# Patient Record
Sex: Female | Born: 1950
Health system: Southern US, Community
[De-identification: ages and names within clinical notes are randomized; demographics above are authoritative.]

## PROBLEM LIST (undated history)

## (undated) DIAGNOSIS — I10 Essential (primary) hypertension: Secondary | ICD-10-CM

## (undated) DIAGNOSIS — K529 Noninfective gastroenteritis and colitis, unspecified: Secondary | ICD-10-CM

## (undated) DIAGNOSIS — I4891 Unspecified atrial fibrillation: Secondary | ICD-10-CM

## (undated) DIAGNOSIS — G5760 Lesion of plantar nerve, unspecified lower limb: Secondary | ICD-10-CM

## (undated) DIAGNOSIS — E039 Hypothyroidism, unspecified: Secondary | ICD-10-CM

## (undated) DIAGNOSIS — R0602 Shortness of breath: Secondary | ICD-10-CM

## (undated) DIAGNOSIS — I82409 Acute embolism and thrombosis of unspecified deep veins of unspecified lower extremity: Secondary | ICD-10-CM

## (undated) HISTORY — DX: Acute embolism and thrombosis of unspecified deep veins of unspecified lower extremity: I82.409

## (undated) HISTORY — PX: ABDOMINAL HYSTERECTOMY: SHX81

## (undated) HISTORY — PX: SHOULDER SURGERY: SHX246

## (undated) HISTORY — DX: Unspecified atrial fibrillation: I48.91

## (undated) HISTORY — DX: Shortness of breath: R06.02

## (undated) HISTORY — PX: NECK SURGERY: SHX720

## (undated) HISTORY — DX: Lesion of plantar nerve, unspecified lower limb: G57.60

## (undated) HISTORY — PX: BREAST CYST ASPIRATION: SHX578

## (undated) HISTORY — DX: Noninfective gastroenteritis and colitis, unspecified: K52.9

## (undated) HISTORY — PX: AUGMENTATION MAMMAPLASTY: SUR837

## (undated) HISTORY — DX: Hypothyroidism, unspecified: E03.9

---

## 1997-12-19 ENCOUNTER — Ambulatory Visit (HOSPITAL_COMMUNITY): Admission: RE | Admit: 1997-12-19 | Discharge: 1997-12-19 | Payer: Self-pay | Admitting: Gastroenterology

## 1998-09-19 ENCOUNTER — Encounter: Payer: Self-pay | Admitting: Family Medicine

## 1998-09-19 ENCOUNTER — Ambulatory Visit (HOSPITAL_COMMUNITY): Admission: RE | Admit: 1998-09-19 | Discharge: 1998-09-19 | Payer: Self-pay | Admitting: Family Medicine

## 1999-07-30 HISTORY — PX: GALLBLADDER SURGERY: SHX652

## 2002-12-06 ENCOUNTER — Encounter: Admission: RE | Admit: 2002-12-06 | Discharge: 2002-12-06 | Payer: Self-pay | Admitting: Gastroenterology

## 2002-12-06 ENCOUNTER — Encounter: Payer: Self-pay | Admitting: Gastroenterology

## 2003-04-25 ENCOUNTER — Encounter: Payer: Self-pay | Admitting: Family Medicine

## 2003-04-25 ENCOUNTER — Encounter: Admission: RE | Admit: 2003-04-25 | Discharge: 2003-04-25 | Payer: Self-pay | Admitting: Family Medicine

## 2004-03-16 ENCOUNTER — Observation Stay (HOSPITAL_COMMUNITY): Admission: EM | Admit: 2004-03-16 | Discharge: 2004-03-17 | Payer: Self-pay | Admitting: Emergency Medicine

## 2004-04-16 ENCOUNTER — Ambulatory Visit (HOSPITAL_COMMUNITY): Admission: RE | Admit: 2004-04-16 | Discharge: 2004-04-16 | Payer: Self-pay | Admitting: Gastroenterology

## 2004-05-14 ENCOUNTER — Ambulatory Visit (HOSPITAL_COMMUNITY): Admission: RE | Admit: 2004-05-14 | Discharge: 2004-05-14 | Payer: Self-pay | Admitting: Gastroenterology

## 2004-05-14 ENCOUNTER — Encounter (INDEPENDENT_AMBULATORY_CARE_PROVIDER_SITE_OTHER): Payer: Self-pay | Admitting: *Deleted

## 2005-04-18 ENCOUNTER — Ambulatory Visit (HOSPITAL_COMMUNITY): Admission: RE | Admit: 2005-04-18 | Discharge: 2005-04-18 | Payer: Self-pay | Admitting: Cardiology

## 2006-03-20 ENCOUNTER — Encounter: Admission: RE | Admit: 2006-03-20 | Discharge: 2006-03-20 | Payer: Self-pay | Admitting: Orthopaedic Surgery

## 2006-06-26 ENCOUNTER — Inpatient Hospital Stay (HOSPITAL_COMMUNITY): Admission: RE | Admit: 2006-06-26 | Discharge: 2006-06-28 | Payer: Self-pay | Admitting: Orthopaedic Surgery

## 2006-10-04 ENCOUNTER — Emergency Department (HOSPITAL_COMMUNITY): Admission: EM | Admit: 2006-10-04 | Discharge: 2006-10-04 | Payer: Self-pay | Admitting: Emergency Medicine

## 2007-03-21 ENCOUNTER — Emergency Department (HOSPITAL_COMMUNITY): Admission: EM | Admit: 2007-03-21 | Discharge: 2007-03-21 | Payer: Self-pay | Admitting: Emergency Medicine

## 2007-03-27 ENCOUNTER — Ambulatory Visit (HOSPITAL_BASED_OUTPATIENT_CLINIC_OR_DEPARTMENT_OTHER): Admission: RE | Admit: 2007-03-27 | Discharge: 2007-03-28 | Payer: Self-pay | Admitting: Orthopedic Surgery

## 2010-12-03 ENCOUNTER — Other Ambulatory Visit: Payer: Self-pay | Admitting: Family Medicine

## 2010-12-03 DIAGNOSIS — N644 Mastodynia: Secondary | ICD-10-CM

## 2010-12-10 ENCOUNTER — Ambulatory Visit
Admission: RE | Admit: 2010-12-10 | Discharge: 2010-12-10 | Disposition: A | Discharge status: Home / Self Care | Payer: BC Managed Care – PPO | Source: Ambulatory Visit | Attending: Family Medicine | Admitting: Family Medicine

## 2010-12-10 DIAGNOSIS — N644 Mastodynia: Secondary | ICD-10-CM

## 2010-12-11 NOTE — Op Note (Signed)
NAME:  Debbie Walters, Debbie Walters              ACCOUNT NO.:  [id number]   MEDICAL RECORD NO.:  [id number]          PATIENT TYPE:  AMB   LOCATION:  DSC                          FACILITY:  [facility]   PHYSICIAN:  Robert A. Thurston Hole, M.D. DATE OF BIRTH:  [birth year]   DATE OF PROCEDURE:  03/27/2007  DATE OF DISCHARGE:                               OPERATIVE REPORT   PREOPERATIVE DIAGNOSIS:  Right mid shaft displaced clavicle fracture.   POSTOPERATIVE DIAGNOSIS:  Right mid shaft displaced clavicle fracture.   PROCEDURE:  Open reduction and internal fixation of right clavicle  fracture.   SURGEON:  Elana Alm. Thurston Hole, M.D.   ASSISTANT:  Julien Girt, P.A.   ANESTHESIA:  General.   OPERATIVE TIME:  Forty-five minutes.   COMPLICATIONS:  None.   INDICATIONS FOR PROCEDURE:  Debbie Walters is a 60 year old woman who had a  significant fall approximately 10 days ago, significant pain with  displaced mid shaft clavicle fracture and is now to undergo ORIF of  this.   DESCRIPTION:  Debbie Walters is brought to the operating room on March 27, 2007, placed on the operative table in the supine position.  After an  adequate level of general anesthesia was obtained, she was placed in a  beach-chair position and her shoulder and arm was prepped using sterile  DuraPrep and draped using sterile technique.  She received Ancef 1 gram  IV preoperatively for prophylaxis.  Initially, through a 5 cm transverse  incision based over the mid shaft clavicle fracture, initial exposure  was made.  The underlying subcutaneous tissues were incised along with  the skin incision.  Carefully the fracture was exposed and the  periosteum was carefully reflected back exposing both the medial and  lateral aspects of the fracture.  The fracture was then held in a  reduced position while an anterior to posterior lag screw was placed  across the fracture site using standard AO technique and a 3.5 mm  cortical screw the appropriate length  was placed.  The provisionally  held the fracture in a reduced and satisfactory position.  A 6-hole  plate was then placed on the superior surface of the clavicle.  The two  most medial drill holes drilled, measured, tapped in the appropriate  length.  A 3.5 mm cortical screw was placed and the two most lateral  screw holes drilled, measured, tapped in the appropriate length, 3.5 mm  cortical screws placed as well.  After this was done, intraoperative  fluoroscopy confirmed anatomic reduction of the fracture with  satisfactory position in the hardware.  They were comminuted fragments  from the mid portion of the fracture and these were placed back into  this portion of the fracture site.  While screw placement was performed,  very careful retraction and very careful retractors were placed to  protect the drills.  At this point, then the wound was irrigated.  The  shoulder could be brought through a full range of motion with excellent  stability of the fracture.  The periosteum was then closed over the  plate and over the clavicle with interrupted 0  Vicryl suture.  Subcutaneous tissues closed with 2-0 Vicryl.  Subcuticular layer closed  with 4-0 Prolene.  Sterile dressings were applied and a sling, and the  patient awakened and taken to the recovery room in stable condition.  Needle and sponge counts correct x2 at the end of the case.   FOLLOW-UP CARE:  Ms. Orzechowski will be followed overnight in the recovery  care center for IV pain control and neurovascular monitoring.  Discharge  tomorrow on Percocet and Robaxin.  She can come back in the office in a  week for wound check and followup.      Robert A. Thurston Hole, M.D.  Electronically Signed     RAW/MEDQ  D:  03/27/2007  T:  03/27/2007  Job:  [id number]

## 2010-12-14 NOTE — Consult Note (Signed)
NAME:  Debbie Walters, Debbie Walters              ACCOUNT NO.:  [id number]   MEDICAL RECORD NO.:  [id number]          PATIENT TYPE:  AMB   LOCATION:  SDS                          FACILITY:  [facility]   PHYSICIAN:  Chief Technology Officer, P.A.    DATE OF BIRTH:  [birth year]   DATE OF CONSULTATION:  DATE OF DISCHARGE:                                   CONSULTATION   CHIEF COMPLAINT:  Neck and right upper extremity pain.   HISTORY OF PRESENT ILLNESS:  The patient is a 60 year old female who has  been having problems with her neck going into her right upper extremity for  some time now.  She was found to have significant degenerative disk disease  as well as foraminal narrowing.  She has failed conservative treatments.  It  is felt her best course of management would be an ACDF procedure from C3 to  C7.  The risks and benefits of this procedure were discussed with the  patient by Dr. Noel Gerold.  She indicated understanding and opted to proceed.   ALLERGIES:  Peanuts.   MEDICATIONS:  Synthroid.   PAST MEDICAL HISTORY:  Hypothyroidism.   PAST SURGICAL HISTORY:  Hysterectomy and knee surgery.   SOCIAL HISTORY:  The patient denies tobacco use.  She drinks one drink of  alcohol per month.  She has three children.   FAMILY HISTORY:  Noncontributory.   REVIEW OF SYSTEMS:  The patient denies any fevers, chills, sweats, or  bleeding tendencies.  CNS:  Denies blurred vision, double vision, seizures,  headaches, paralysis.  CARDIOVASCULAR:  No chest pain, angina, orthopnea,  claudication, or palpitations.  PULMONARY:  No shortness of breath,  productive cough, or hemoptysis.  GI:  Denies nausea, vomiting,  constipation, diarrhea, melena, or bloody stool.  GU:  Denies history of  hematuria or discharge.  MUSCULOSKELETAL:  As per HPI.   PHYSICAL EXAMINATION:  VITAL SIGNS:  Blood pressure 120/70, respirations are  16 and unlabored.  GENERAL APPEARANCE:  The patient is 60 year old female who is alert and  oriented, in no  acute distress.  She is well nourished, well groomed,  appears stated age, pleasant and cooperative to exam.  HEENT:  Head is normocephalic, atraumatic.  Pupils equal, round, and  reactive to light.  Extraocular movements intact.  Nares patent.  Pharynx is  clear.  NECK:  Soft and supple to palpation.  No lymphadenopathy, thyromegaly, or  bruits appreciated.  CHEST:  Clear to auscultation bilaterally, no rales, rhonchi, stridor,  wheezes, or friction rubs present, no pertinent and not performed.  HEART:  S1, S2, regular rate and rhythm, no murmurs, gallops, or rubs noted.  ABDOMEN:  Soft to palpation, nontender, nondistended, no organomegaly noted.  GU:  Not pertinent and not performed.  EXTREMITIES:  As per HPI.  SKIN:  Intact without lesions or rashes.   IMPRESSION:  1. Cervical spondylytic radiculopathy C3 to C7.  2. Hypothyroidism.   PLAN:  1. Admit to [facility] on June 26, 2006 for an ACDF C3 to C7.  2. The patient's primary care doctor is Dr. Merri Brunette.  Verlin Fester, P.A.     CM/MEDQ  D:  06/12/2006  T:  06/12/2006  Job:  [id number]   cc:   Sharolyn Douglas, M.D.

## 2010-12-14 NOTE — Cardiovascular Report (Signed)
NAME:  Debbie Walters, Debbie Walters                        ACCOUNT NO.:  [id number]   MEDICAL RECORD NO.:  [id number]                   PATIENT TYPE:  INP   LOCATION:  1826                                 FACILITY:  [facility]   PHYSICIAN:  Thereasa Solo. Little, M.D.              DATE OF BIRTH:  [birth year]   DATE OF PROCEDURE:  03/16/2004  DATE OF DISCHARGE:                              CARDIAC CATHETERIZATION   INDICATIONS FOR TEST:  This 60 year old female presents with a 2-week  history of chest pain, a week ago severe chest pain lasting 18 hours,  associated with marked diaphoresis and shortness of breath, moderate fatigue  since that time, and then recurrent mild pain starting on Tuesday, and now,  3 days later, severe chest pain, elephant sitting on her chest, diaphoretic,  presented to [facility], had no acute changes and was sent to [facility].  She is still having 3-4/10 chest discomfort.  Her examination is  unremarkable, but her family history includes a sister at age 69 with an MI.   DESCRIPTION OF PROCEDURE:  The patient was taken to the cath lab on an  urgent basis for a left heart catheterization.   After obtaining informed consent, the patient was prepped and draped in the  usual sterile fashion, exposing the right groin.  After applying local  anesthetic with 1% Xylocaine, the Seldinger technique was employed and a 6-  [place] introducer sheath was placed into the right femoral artery.  Left and  right coronary arteriography and ventriculography in the RAO projection were  performed.   EQUIPMENT:  The 6-French Judkins configuration catheters.   TOTAL CONTRAST:  100 mL.   COMPLICATIONS:  None.   RESULTS:  Central aortic pressure was 123/75, left ventricular pressure was  119/3 and there was no aortic valve gradient noted at time of pullback.   VENTRICULOGRAPHY:  Ventriculography in the RAO projection using 20 mL of  contrast at 12 mL per second revealed normal left  ventricular systolic  function.  Ejection fraction greater than 55% and the left ventricular end-  diastolic pressure was 8.   CORONARY ARTERIOGRAPHY:  On fluoroscopy, no calcification was seen.   1. Left main normal, it bifurcated.  2. LAD:  The LAD extended to the apex of the heart.  There was a very large     first diagonal that was free of disease.  3. Circumflex:  The circumflex gave rise to 2 OM vessels, both of which were     free of disease.  4. RCA:  The RCA supplied the PDA, and the RCA and PDA were free of disease.   CONCLUSION:  1. No evidence of coronary artery disease.  2. Normal left ventricular systolic function.   DISCUSSION:  Her story is excellent for angina, but she clearly has no  coronary disease and she has nothing to suggest coronary spasm with a  negative first troponin,  which should still be elevated if she has had pain  all week long, and no wall motion abnormalities.   I have ordered a CT scan of her chest to rule out a pulmonary embolus and  she should be ready for discharge in the morning.  If the CT scan is  unremarkable, she will need a GI evaluation and I will refer her back to Dr.  Dario Guardian for this.                                               Thereasa Solo. Little, M.D.    ABL/MEDQ  D:  03/16/2004  T:  03/17/2004  Job:  [id number]   cc:   Dario Guardian, M.D.  [address]  [place]  [place] [address]  Fax: [phone]   [facility]

## 2010-12-14 NOTE — Op Note (Signed)
NAME:  Debbie Walters, Debbie Walters              ACCOUNT NO.:  [id number]   MEDICAL RECORD NO.:  [id number]          PATIENT TYPE:  AMB   LOCATION:  ENDO                         FACILITY:  [facility]   PHYSICIAN:  Anselmo Rod, M.D.  DATE OF BIRTH:  Dec 03, 1950   DATE OF PROCEDURE:  05/14/2004  DATE OF DISCHARGE:                                 OPERATIVE REPORT   PROCEDURE PERFORMED:  Esophagogastroduodenoscopy with gastric and small  bowel biopsies.   ENDOSCOPIST:  Charna Elizabeth, M.D.   INSTRUMENT USED:  Olympus video panendoscope.   INDICATIONS FOR PROCEDURE:  The patient is a 60 year old white female with a  history of epigastric pain and intermittent diarrhea, peptic ulcer disease,  small biopsy done to rule out sprue.   PREPROCEDURE PREPARATION:  Informed consent was procured from the patient.  The patient was fasted for eight hours prior to the procedure.   PREPROCEDURE PHYSICAL:  The patient had stable vital signs.  Neck supple,  chest clear to auscultation.  S1, S2 regular.  Abdomen soft with normal  bowel sounds.   DESCRIPTION OF PROCEDURE:  The patient was placed in the left lateral  decubitus position and sedated with 50 mg of Demerol and 5 mg of Versed in  slow incremental doses.  Once the patient was adequately sedated and  maintained on low-flow oxygen and continuous cardiac monitoring, the Olympus  video panendoscope was advanced through the mouth piece over the tongue into  the esophagus under direct vision.  The entire esophagus appeared normal  with no evidence of ring, stricture, masses, esophagitis or Barrett's  mucosa.  The scope was then advanced to the stomach.  A superficial ulcer  was in the antrum with multiple erosions.  Biopsies were done to rule out  presence of Helicobacter pylori by pathology.  Small bowel biopsies were  done to rule out sprue, no small bowel lesions were identified.  There was  no outlet obstruction.  Retroflexion in the high cardia revealed no  abnormalities.  The patient tolerated the procedure well without  complications.   IMPRESSION:  1.  Normal-appearing esophagus and proximal small bowel.  2.  Superficial ulcer in antrum with multiple erosions.  Biopsies done for      Helicobacter pylori by pathology.  3.  Small bowel biopsy done to rule out sprue.   RECOMMENDATIONS:  1.  Await pathology results.  2.  Continue proton pump inhibitor.  3.  Avoid all nonsteroidals.  4.  Outpatient followup in the next two weeks for further recommendations.      Will treat with antibiotics if Helicobacter pylori present on biopsy.       JNM/MEDQ  D:  05/14/2004  T:  05/14/2004  Job:  [id number]   cc:   Dario Guardian, M.D.  [address]  [place]  [place] [address]  Fax: [phone]

## 2010-12-14 NOTE — Op Note (Signed)
NAME:  Debbie Walters, Debbie Walters              ACCOUNT NO.:  [id number]   MEDICAL RECORD NO.:  [id number]          PATIENT TYPE:  OIB   LOCATION:  2550                         FACILITY:  [facility]   PHYSICIAN:  Sharolyn Douglas, M.D.        DATE OF BIRTH:  Aug 08, 1950   DATE OF PROCEDURE:  06/26/2006  DATE OF DISCHARGE:                               OPERATIVE REPORT   DIAGNOSIS:  1. Cervical spondylotic radiculopathy.  2. Neck and bilateral upper extremity pain and stiffness.   PROCEDURE:  1. Anterior cervical discectomy C3-C4, C4-C5, C5-C6 and C6-C7 with      decompression of the thecal sac and nerve roots bilaterally.  2. Anterior cervical arthrodesis C3-C4, C4-C5, C5-C6, C6-C7 with      placement of four PEEK spacers packed with autogenous iliac crest      bone graft.  3. Anterior cervical plating from C3 to C7 using the [employer]      System.  4. Left anterior iliac crest bone graft supplemented with local      autogenous bone graft.   SURGEON:  Sharolyn Douglas, M.D.   ASSISTANTJill Side Mahar, P.A.-C.   ANESTHESIA:  General endotracheal.   ESTIMATED BLOOD LOSS:  50 mL.   COMPLICATIONS:  None.   COUNTS:  Needle and sponge count correct.   INDICATIONS:  The patient is a pleasant 60 year old female with chronic  progressive neck and bilateral upper extremity pain and stiffness.  She  has a degenerative spondylolisthesis at C3-C4, multilevel spondylosis  with disc space narrowing most advanced at C5-C6 and C6-C7.  Because of  her persistent symptoms refractory to all conservative treatment, she  now elects to undergo ACDF from C3 to C7 in hopes of improving her  symptoms.  The risks, benefits, and alternatives were reviewed and she  has elected to proceed.   DESCRIPTION OF PROCEDURE:  After informed consent, she was taken to the  operating room.  She underwent general endotracheal anesthesia without  difficulty.  She was given prophylactic IV antibiotics.  She was  carefully positioned  prone with the Mayfield headrest and the neck in  slight extension.  5 pounds of halter traction was applied.  The neck  was prepped and draped in the usual sterile fashion.  An incision was  made in a natural skin crease just above the cricoid cartilage from just  past the midline extending laterally.  Dissection was carried sharply  through the platysma.  The interval between the SCM and strap muscles  was developed down to the prevertebral space.  The anterior cervical  spine was identified and a spinal needle was placed into the C4-C5 disc  space and x-ray taken to confirm the level.  The esophagus, trachea, and  carotid sheath were identified and protected at all times.   The longus coli muscle was elevated out over the C3 through C7 disc  spaces.  The anterior osteophytes were removed with a Leksell rongeur.  The Shadowline retractor was placed.  The microscope was draped and  brought into the field.  Caspar distraction pins were placed into the  C3  and C4 vertebral bodies.  A discectomy was completed back to the  posterior longitudinal ligament.  The cartilaginous endplates were  scraped clean.  A high speed bur was used take down the posterior  vertebral margins and uncovertebral joints.  The posterior longitudinal  ligament was taken down using micro-Kerrison punches.  We identified a  disc osteophyte complex on the right side and this was carefully removed  from the spinal canal and foramen.   Once we were satisfied with the decompression, we turned our attention  to obtaining bone graft from the left anterior iliac crest.  A 2 cm  incision was made.  Dissection was carried through the deep fascia.  The  iliac crest was identified and a Leksell rongeur was used to remove the  cap.  Curets were then used to remove copious amounts of autogenous  iliac crest bone graft.  Hemostasis was achieved using FloSeal.  The  deep fascia was closed with an 0 Vicryl, the subcutaneous layer  was  closed with 2-0 Vicryl, and then Dermabond was used on the skin.   We then turned our attention back to the cervical spine.  A 6-mm PEEK  cage was chosen and packed with the autogenous iliac crest bone graft.  This was supplemented with some local bone graft obtained from the  drilling of the uncovertebral joints.  The spacer was inserted into the  interspace and tamped 1 mm deep.  We then moved the [facility] distraction  pins down to C4-C5 and a similar procedure was carried out.  Again, we  performed bilateral foraminotomies.  The posterior longitudinal ligament  was not taken down at this level.  A 6 mm spacer was again utilized and  again packed with the autogenous iliac crest bone graft and the local  bone graft.  The cage was again countersunk 1 mm.  We then moved the  [facility] distraction pins down to C5-C6.  At this level, the disc space  was severely narrowed.  The uncovertebral joints were taken down using  the high speed drill and foraminotomies were completed.  The disc was  mobile and ultimately we were able to place a 6-mm PEEK spacer, again  packed with the autogenous iliac crest bone graft.  The graft was  countersunk 1 mm.  Caspar distraction pins were moved down to the C6-C7  level. Again, the disc space was very narrowed but after a radical  discectomy and foraminotomy, we were able to achieve some distraction  and a 6-mm PEEK spacer was utilized, again packed with the autogenous  iliac crest bone graft and local bone graft.  The spacer was countersunk  1 mm.   We then placed an anterior cervical plate from C3 down to C7 using ten  12 mm screws.  We ensured that the locking mechanism engaged.  The bone  quality was soft, however, the screw purchase was adequate.  We took an  interoperative x-ray which showed appropriate positioning of the  implants.  The wound was irrigated.  Hemostasis was achieved.  The esophagus, trachea, carotid sheath were examined and there were  no  apparent injuries.  A deep TLS sucker was left in place.  The platysma  was closed with an interrupted 2-0 Vicryl suture, the subcutaneous layer  closed with an interrupted 3-0 Vicryl suture, followed by a running 4-0  subcuticular Vicryl suture on the skin edges.  Benzoin and Steri-Strips  were placed.  A sterile dressing was applied.  The patient was placed  into an Aspen cervical collar, extubated without difficulty, and  transferred to recovery in stable condition able to move her upper lower  extremities.   It should be noted my assistant, [employer], P.A.-C., was present  throughout the procedure including during positioning, the exposure, the  decompression, the instrumentation, the arthrodesis, and she also  assisted with the bone graft and wound closure.      Sharolyn Douglas, M.D.  Electronically Signed     MC/MEDQ  D:  06/26/2006  T:  06/27/2006  Job:  [id number]

## 2011-01-07 ENCOUNTER — Observation Stay (HOSPITAL_COMMUNITY)
Admission: AD | Admit: 2011-01-07 | Discharge: 2011-01-08 | DRG: 183 | Disposition: A | Discharge status: Home / Self Care | Payer: BC Managed Care – PPO | Source: Ambulatory Visit | Attending: Cardiology | Admitting: Cardiology

## 2011-01-07 DIAGNOSIS — I2510 Atherosclerotic heart disease of native coronary artery without angina pectoris: Secondary | ICD-10-CM | POA: Insufficient documentation

## 2011-01-07 DIAGNOSIS — K319 Disease of stomach and duodenum, unspecified: Principal | ICD-10-CM | POA: Insufficient documentation

## 2011-01-07 DIAGNOSIS — I498 Other specified cardiac arrhythmias: Secondary | ICD-10-CM | POA: Insufficient documentation

## 2011-01-07 DIAGNOSIS — E039 Hypothyroidism, unspecified: Secondary | ICD-10-CM | POA: Insufficient documentation

## 2011-01-07 DIAGNOSIS — I10 Essential (primary) hypertension: Secondary | ICD-10-CM | POA: Insufficient documentation

## 2011-01-07 DIAGNOSIS — G43909 Migraine, unspecified, not intractable, without status migrainosus: Secondary | ICD-10-CM | POA: Insufficient documentation

## 2011-01-07 DIAGNOSIS — R0789 Other chest pain: Secondary | ICD-10-CM | POA: Insufficient documentation

## 2011-01-07 LAB — CARDIAC PANEL(CRET KIN+CKTOT+MB+TROPI): CK, MB: 2.8 ng/mL (ref 0.3–4.0)

## 2011-01-07 LAB — COMPREHENSIVE METABOLIC PANEL
ALT: 37 U/L — ABNORMAL HIGH (ref 0–35)
AST: 27 U/L (ref 0–37)
Albumin: 3.7 g/dL (ref 3.5–5.2)
Alkaline Phosphatase: 74 U/L (ref 39–117)
BUN: 18 mg/dL (ref 6–23)
GFR calc Af Amer: >60 mL/min (ref >60)
Glucose, Bld: 92 mg/dL (ref 70–99)
Total Bilirubin: 0.2 mg/dL — ABNORMAL LOW (ref 0.3–1.2)

## 2011-01-07 LAB — CBC
HCT: 36.1 % (ref 36.0–46.0)
Hemoglobin: 12.5 g/dL (ref 12.0–15.0)
RBC: 4.19 MIL/uL (ref 3.87–5.11)
RDW: 13.3 % (ref 11.5–15.5)
WBC: 7.1 10*3/uL (ref 4.0–10.5)

## 2011-01-07 LAB — PROTIME-INR: Prothrombin Time: 13.3 seconds (ref 11.6–15.2)

## 2011-01-08 HISTORY — PX: CARDIAC CATHETERIZATION: SHX172

## 2011-01-08 LAB — CARDIAC PANEL(CRET KIN+CKTOT+MB+TROPI)
Total CK: 134 U/L (ref 7–177)
Troponin I: <0.3 ng/mL (ref <0.30)

## 2011-01-08 LAB — CBC
Hemoglobin: 12.2 g/dL (ref 12.0–15.0)
MCV: 86.4 fL (ref 78.0–100.0)
Platelets: 199 10*3/uL (ref 150–400)
RDW: 13.5 % (ref 11.5–15.5)

## 2011-01-08 LAB — LIPID PANEL
Cholesterol: 181 mg/dL (ref 0–200)
LDL Cholesterol: 110 mg/dL — ABNORMAL HIGH (ref 0–99)
VLDL: 18 mg/dL (ref 0–40)

## 2011-01-14 NOTE — H&P (Signed)
NAMEMarland Kitchen  Debbie Walters, Debbie Walters NO.:  [id number]  MEDICAL RECORD NO.:  [id number]  LOCATION:  3705                         FACILITY:  [facility]  PHYSICIAN:  Debbie Corporal, MD DATE OF BIRTH:  1950/10/15  DATE OF ADMISSION:  01/07/2011 DATE OF DISCHARGE:                             HISTORY & PHYSICAL   REFERRING PHYSICIAN:  Dario Guardian, MD, but she was actually seen by Debbie Walters, PAC at [facility].  REASON FOR ADMISSION:  Chest pain, rule out MI.  HISTORY OF PRESENT ILLNESS:  Ms. Debbie Walters is a very pleasant 60 year old woman with a distant history of hypertension, but not only current medications, other than occasional furosemide as well as hypothyroidism, on Synthroid.  She has had a heart catheterization back in 2005 by Dr. Clarene Walters for symptoms concerning for coronary disease and was noted to have nonobstructive coronary disease.  She also has had an echocardiogram, which was essentially normal with EF 55% with no significant abnormalities in 2007.  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  She began having substernal chest pain, severe lower substernal chest pain about 10/10 at the worst associated with feeling diaphoretic, was one nauseated, and short of breath.  The pain did go toward her jaw and up to her left arm and shoulders.  The pain would kind of come and go throughout the course of the day on Friday.  She continued to work, but was really kind of feeling poorly, but she continued to work throughout the day.  The pain would kinda come and then, will go when she would sit down and then would come back again and it did this throughout most of the day and then, when she got home, was resting, and then was somewhat improved, but the next day, on Saturday she felt extremely poor and was felt lethargic and fatigued.  She still has some residual heaviness  in her chest that would definitely get worse with getting on moving around, but she continued to try to work throughout the day which is basically felt poorly the entire day.  She did not have any PND, orthopnea, or edema associated with this and had normal shortness of breath after Friday.  She then basically stayed in bed all day Sunday and then, today when she tried to get up and start doing things she again noted feeling a chest heaviness and little bit of shortness of breath and so, she went to her primary care's office.  There, she was given 3 baby aspirin which really helped the pain.  After she had taken a GI cocktail which really did not help her very much.  She really is complaining of any reflux- type symptoms and said that she just generally feels just kinda lethargic and was a little heaviness in her chest.  That kinda comes and goes, walking into the clinic, for instance.  She otherwise denies any real worsening of her edema just off and on intermittently for which she takes her Lasix.  No palpitations.  No lightheadedness, dizziness, ooziness, syncope, no claudication.  No melena or  hematochezia.  No recent fevers, chills, or cough.  No wheezing.  REVIEW OF SYSTEMS:  Otherwise, no GU symptoms, just some occasional constipation for GI symptoms, has some musculoskeletal joint pains, but nothing out of the ordinary for her.  No depression, anxiety-type symptoms.  No visual changes, no TIA or amaurosis fugax symptoms.  No syncope or near-syncope-type symptoms.  Just overall just fatigue and chest tightness is the main issues as well as noted above.  CURRENT MEDICATIONS: 1. Furosemide 20 mg tablets once a day p.r.n. pain. 2. Levothyroxine 125 mcg daily.  ALLERGIES:  No known drug allergies.  PAST MEDICAL HISTORY:  As indicated above: 1. Hypothyroidism. 2. History of hemorrhoid. 3. No history of cardiac evaluation with a normal echocardiogram 2007     and essentially, no  significant coronary artery disease and     catheterization in 2005 by Dr. Julieanne Walters. 4. Bilateral salpingo-oophorectomy for cervical cancer in the past.  SURGICAL HISTORY: 1. Hysterectomy. 2. Bilateral salpingo-oophorectomy for cervical cancer in the past. 3. History of breast augmentation surgery. 4. Cholecystectomy. 5. Cervical spine fusions, laminectomy. 6. Shoulder surgery for a fall and fracture in 2007.  FAMILY HISTORY:  Father died  at 77 years of a cirrhosis and was a long- term drinker.  Father died of, it sounds like some type of pancreatitis or pancreatic cancer, had an MI as well as renal insufficiency and diabetes.  Paternal grandmother and grandfather were died in the 25s in an early coronary disease.  She had maternal grandfather died of 83 with coronary disease and maternal grandmother died at 54 with coronary disease, had a brother died of 43 with coronary disease, had an MI, he also has a diabetes.  Another brother is a nondrinker.  He has had pancreatitis.  She has got a sister who died at 61 with MI and she had coronary disease and diabetes.  Another sister died at 75 with a coronary disease and had diabetes.  SOCIAL HISTORY:  She never smoked and has not really exposed to smoking. She has occasional alcohol beverage.  No recreational drug use.  She does not have any structure alcohol.  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  She is divorced with 3 children, 5 grandchildren.  She says she tries to walk maybe once a week in the gym for 60 minutes a day, but has not done it recently.  From [place], she was given GI cocktail with no help, 3 baby aspirins and that is somewhat alleviated her symptoms.  She had troponins drawn, which was still pending.  EKG:  There was basically sinus bradycardia and essentially normal EKG, heart rate was 54 beats per minute.  On repeat EKG here with 1/10 chest heaviness, heart rate again was  54 beats per minute, normal sinus bradycardia with normal EKG.  No ischemic changes.  PHYSICAL EXAMINATION:  GENERAL:  She is very pleasant, somewhat distressed woman, but she looks nontoxic and not currently having chest discomfort. VITAL SIGNS:  She is 5 feet 2 inches, 156 pounds with BMI of 27.  Her blood pressures elevated 170/100, heart rate is 54 beats per minute. HEENT: Normocephalic, atraumatic.  Extraocular muscles intact. NECK:  Supple without lymphadenopathy, thyromegaly, JVD.  No carotid bruit. LUNGS:  Clear to auscultation and percussion bilaterally.  Normal respiratory effort.  Good air movement.  No wheezes, rales, or rhonchi. HEART:  Regular rate and rhythm, normal S1 and S2.  No murmurs, rubs, or gallops.  A 2+ and equal pulses throughout.  Normal placed PMI. ABDOMEN:  Soft, nontender, and nondistended.  Normoactive bowel sounds. No hepatosplenomegaly. EXTREMITIES:  No clubbing, cyanosis, or edema.  A 2+ and equal pulses throughout. MUSCULOSKELETAL:  I was unable to palpate an reproduce any of her chest discomfort symptoms.  IMPRESSION: 1. Chest pain with her history of negative cath is quite likely     nonanginal, but she has extensive past medical history and does     have a history of hypertension and is being hypertensive today.     This could just be due to hypertension, but I think with all the     symptoms, she had on Friday and then, persistently having chest     heaviness and pressure with any type of activity for the last 3     days.  This is worrisome if this could be an acute coronary     syndrome with potentially either post-infarct or post-acute     coronary syndrome angina-type equivalent.  I definitely want to see     what the troponins look like, but I think it just for safety     purposes would prefer to go ahead and have her admitted directly to     the hospital.  She does want to go home and get a bag, but she can     be directly admitted to  the hospital for initiation of intravenous     heparin and nitroglycerin, to get her blood pressure down.  I think     I am just going to go ahead and schedule her to undergo a heart     catheterization tomorrow, but one of my colleagues in order to     definitely assess her coronary anatomy, answer the question once     for all.  I am reluctant to trust the stress test with her ongoing     symptoms that this initially would be a safe option.  I did discuss     the risks, benefits, alternatives, and indications of a     catheterization with her.  She regrets not having come in to see     someone on Friday, but does understand that agree with the decision     to proceed as such.  So we will go ahead and get scheduled for     direct admission.  Discussed with our inpatient service.  I will     then see her in follow up afterwards. 2. I am going to start her amlodipine versus an ACE inhibitor     depending 1 mL what is found.  I will put on a low-dose beta     blocker, although, I will hold parameters for heart rate     approximately 60 beats per minute, which will likely mean it is     held.          ______________________________ Debbie Corporal, MD     DWH/MEDQ  D:  01/07/2011  T:  01/08/2011  Job:  [id number]  cc:   Debbie Josephs, PA Debbie Walters, M.D.  Electronically Signed by Bryan Lemma MD on 01/14/2011 07:28:51 AM

## 2011-01-19 NOTE — Discharge Summary (Signed)
Debbie Walters, Debbie NO.:  [id number]  MEDICAL RECORD NO.:  [id number]  LOCATION:  3705                         FACILITY:  [facility]  PHYSICIAN:  Landry Corporal, MD DATE OF BIRTH:  [birth year]  DATE OF ADMISSION:  01/07/2011 DATE OF DISCHARGE:  01/08/2011                              DISCHARGE SUMMARY   DISCHARGE DIAGNOSES: 1. Chest pain, noncardiac secondary to gastrointestinal source. 2. Migraine headaches. 3. Stable hypothyroidism. 4. Hypertension, resolved. 5. Bradycardia with beta-blocker and heart rate into the 40s.  DISCHARGE CONDITION:  Improved and stable.  PROCEDURES:  Combined left heart cath by Dr. Nanetta Batty, January 08, 2011.  HOSPITAL COURSE:  A 60 year old female, presented to see Dr. Herbie Baltimore secondary to chest pain.  She previously had had a cardiac cath in 2005 that was normal coronary artery, EF 55%.  On June 8, she began having substernal chest pain, severe lower substernal pain and became diaphoretic, nauseated and short of breath.  The pain did go up toward her jaw and to her left arm and shoulders.  The pain came and went on Friday.  She continued to work though she did not feel very well.  By Saturday, she felt lethargic and fatigue.  She still had residual heaviness in her chest.  No associated shortness of breath.  Sunday, she rested all day.  When she tried to exert herself, she felt chest heaviness and short of breath.  She went to her primary care office, 3 baby aspirin helped the pain.  She then was set up to see Dr. Herbie Baltimore. In our office, she was hypertensive with a blood pressure of 170/100, heart beat was 54 beats per minute.  He was concerned that it could be anginal.  Her admitted her to [facility] with heparin and nitroglycerin. She became pain free.  Did develop some migraine headache there.  Plans were for cardiac catheterization.  After her cath, she had a brief episodes of chest discomfort, but she ate her lunch  and the pain resolved on its done without any medication.  Blood pressure here has been controlled 141/85.  The highest has been as 153/77 which was after the heart catheterization, heart rate has been as low at 43.  We will discontinue her beta-blocker.  She did receive a dose at 10:00 a.m. today, and we will add medication for blood pressure and ACE inhibitor. Also, add Protonix 40 mg daily.  Before she is discharged, we will have her ambulate.  LABORATORY VALUES:  Cardiac enzymes were all negative with CKs 134, MBs 2.3, troponin I less than 0.30.  Hemoglobin was 12.2, hematocrit 36.2, WBC was 5.3, and platelets 199.  Sodium 138, potassium 3.7, chloride 104, CO2 26, glucose 92, BUN 18, creatinine 0.80, total bili 0.2, alkaline phos 74, SGOT 27, SGPT 37, calcium 9.2.  PTT 29, PT was 13.3, and INR 0.99.  The patient ambulates without further problems.  She will be discharged home and follow up as instructed.  DISCHARGE MEDICATIONS:  See medication reconciliation sheet.  DISCHARGE INSTRUCTIONS: 1. Increase activity slowly.  May shower on January 09, 2011.  No lifting     for 1 week.  No driving for  2 days. 2. Heart-healthy diet. 3. Wash cath site with soap and water.  Call if any bleeding, swelling     or drainage. 4. Follow up with Dr. Clarene Duke.  Our office will call with date and     time.  Follow up with Dr. Katrinka Blazing.     Darcella Gasman. Annie Paras, N.P.   ______________________________ Landry Corporal, MD    LRI/MEDQ  D:  01/08/2011  T:  01/09/2011  Job:  [id number]  cc:   Thereasa Solo. Little, M.D. Dario Guardian, M.D.  Electronically Signed by Nada Boozer N.P. on 01/14/2011 01:56:01 PM Electronically Signed by Bryan Lemma MD on 01/19/2011 11:34:39 AM

## 2011-01-22 NOTE — Cardiovascular Report (Signed)
NAMEMarland Walters  TIASHA, HELVIE NO.:  [id number]  MEDICAL RECORD NO.:  [id number]  LOCATION:                                 FACILITY:  PHYSICIAN:  Nanetta Batty, M.D.   DATE OF BIRTH:  1951/07/20  DATE OF PROCEDURE: DATE OF DISCHARGE:                           CARDIAC CATHETERIZATION   Ms. Stauffer is a 60 year old married Caucasian female, patient of Dr. Merri Brunette with positive risk factors, who had a cath performed by Dr. Clarene Duke 7 years ago that was normal.  She was seen by Laurell Josephs, nurse practitioner in their office yesterday with symptoms of accelerated chest pain and was admitted to [facility] where she was placed on IV heparin and nitro.  Pain subsided.  Enzymes were negative. Her EKG showed no acute changes.  She presents now for diagnostic coronary angiography to define her anatomy and rule out ischemic etiology.  PROCEDURE DESCRIPTION:  The patient was brought to the second floor [facility] in a postabsorptive state.  She was premedicated with p.o. Valium.  Her right groin was prepped and shaved in usual sterile fashion.  A 1% Xylocaine was used for local anesthesia. A 5-French sheath was inserted into the right femoral artery using standard Seldinger technique.  A 5-French right-left Judkins diagnostic catheter as well as pigtail catheter were used for selective coronary angiography, left ventriculography, and supravalvular aortography. Visipaque dye was used for the entirety of the case.  Retrograde aortic, left ventricular, and pullback pressures were recorded.  HEMODYNAMIC RESULTS: 1. Aortic systolic pressure 152, diastolic pressure 82. 2. Left ventricle systolic pressure 147, end-diastolic pressure 13. 3. Selective coronary angiography.     a.     Left main normal.     b.     LAD normal.     c.     Left circumflex normal.     d.     Right coronary artery is dominant normal. 4. Ventriculography; RAO left ventriculogram  performed using 25 mL of     Visipaque dye at 12 mL per second.  The overall LVEF was estimated     greater than 60% without focal wall motion abnormalities. 5. Supravalvular aortography; performed in the LAO view using 20 mL of     Visipaque dye at 20 mL per second.  The aortic root was normal in     caliber.  There is no dissection visible.  The arch vessels were     intact.  IMPRESSION:  [place] has essentially normal coronary arteries, normal LV function.  There is no cardiac etiology for chest pain that to be documented.  An ACT was measured.  The sheath was removed.  Pressure was held in the groin to achieve hemostasis.  The patient left the lab in stable condition.  She will be discharged home later today after she remains recumbent for 5 hours on empiric antireflux medications.  We will follow up with Dr. Merri Brunette and Dr. Clarene Duke.     Nanetta Batty, M.D.     JB/MEDQ  D:  01/08/2011  T:  01/09/2011  Job:  [id number]  cc:   Second Floor [facility] Dario Guardian, M.D.  Electronically Signed by Nanetta Batty M.D. on 01/22/2011 09:22:35 AM

## 2011-05-10 LAB — POCT HEMOGLOBIN-HEMACUE
Hemoglobin: 12.5
Operator id: 123881

## 2011-07-01 ENCOUNTER — Other Ambulatory Visit: Payer: Self-pay | Admitting: Family Medicine

## 2011-07-01 ENCOUNTER — Ambulatory Visit
Admission: RE | Admit: 2011-07-01 | Discharge: 2011-07-01 | Disposition: A | Discharge status: Home / Self Care | Payer: BC Managed Care – PPO | Source: Ambulatory Visit | Attending: Family Medicine | Admitting: Family Medicine

## 2011-07-01 DIAGNOSIS — R1032 Left lower quadrant pain: Secondary | ICD-10-CM

## 2011-07-01 MED ORDER — IOHEXOL 300 MG/ML  SOLN
100.0000 mL | Freq: Once | INTRAMUSCULAR | Status: AC | PRN
  Administered 2011-07-01: 100 mL via INTRAVENOUS

## 2013-05-26 ENCOUNTER — Emergency Department (HOSPITAL_COMMUNITY)
Admission: EM | Admit: 2013-05-26 | Discharge: 2013-05-26 | Disposition: A | Discharge status: Home / Self Care | Payer: BC Managed Care – PPO | Attending: Emergency Medicine | Admitting: Emergency Medicine

## 2013-05-26 ENCOUNTER — Encounter (HOSPITAL_COMMUNITY): Payer: Self-pay | Admitting: Emergency Medicine

## 2013-05-26 ENCOUNTER — Emergency Department (HOSPITAL_COMMUNITY): Payer: BC Managed Care – PPO

## 2013-05-26 DIAGNOSIS — I10 Essential (primary) hypertension: Secondary | ICD-10-CM | POA: Insufficient documentation

## 2013-05-26 DIAGNOSIS — R0602 Shortness of breath: Secondary | ICD-10-CM | POA: Insufficient documentation

## 2013-05-26 DIAGNOSIS — Z91040 Latex allergy status: Secondary | ICD-10-CM | POA: Insufficient documentation

## 2013-05-26 DIAGNOSIS — I82409 Acute embolism and thrombosis of unspecified deep veins of unspecified lower extremity: Secondary | ICD-10-CM | POA: Insufficient documentation

## 2013-05-26 DIAGNOSIS — I82402 Acute embolism and thrombosis of unspecified deep veins of left lower extremity: Secondary | ICD-10-CM

## 2013-05-26 DIAGNOSIS — Z79899 Other long term (current) drug therapy: Secondary | ICD-10-CM | POA: Insufficient documentation

## 2013-05-26 DIAGNOSIS — I498 Other specified cardiac arrhythmias: Secondary | ICD-10-CM | POA: Insufficient documentation

## 2013-05-26 HISTORY — DX: Essential (primary) hypertension: I10

## 2013-05-26 LAB — PROTIME-INR
INR: 1.01 (ref 0.00–1.49)
Prothrombin Time: 13.1 s (ref 11.6–15.2)

## 2013-05-26 LAB — POCT I-STAT, CHEM 8
BUN: 12 mg/dL (ref 6–23)
Calcium, Ion: 1.22 mmol/L (ref 1.13–1.30)
Chloride: 103 mEq/L (ref 96–112)
Creatinine, Ser: 1.1 mg/dL (ref 0.50–1.10)
Glucose, Bld: 88 mg/dL (ref 70–99)
HCT: 37 % (ref 36.0–46.0)
Hemoglobin: 12.6 g/dL (ref 12.0–15.0)
Potassium: 4 meq/L (ref 3.5–5.1)
Sodium: 142 mEq/L (ref 135–145)
TCO2: 27 mmol/L (ref 0–100)

## 2013-05-26 LAB — POCT I-STAT TROPONIN I: Troponin i, poc: 0 ng/mL (ref 0.00–0.08)

## 2013-05-26 MED ORDER — RIVAROXABAN 15 MG PO TABS
15.0000 mg | ORAL_TABLET | Freq: Once | ORAL | Status: AC
  Administered 2013-05-26: 15 mg via ORAL
  Filled 2013-05-26: qty 1

## 2013-05-26 MED ORDER — IOHEXOL 350 MG/ML SOLN
80.0000 mL | Freq: Once | INTRAVENOUS | Status: AC | PRN
  Administered 2013-05-26: 65 mL via INTRAVENOUS

## 2013-05-26 MED ORDER — RIVAROXABAN 20 MG PO TABS: 20.0000 mg | ORAL_TABLET | Freq: Every day | ORAL | Status: DC

## 2013-05-26 MED ORDER — RIVAROXABAN 15 MG PO TABS: 15.0000 mg | ORAL_TABLET | Freq: Two times a day (BID) | ORAL | Status: DC

## 2013-05-26 MED ORDER — SODIUM CHLORIDE 0.9 % IV SOLN
Freq: Once | INTRAVENOUS | Status: AC
  Administered 2013-05-26: 18:00:00 via INTRAVENOUS

## 2013-05-26 NOTE — ED Provider Notes (Signed)
CSN: [id number]     Arrival date & time 05/26/13  1632 History   First MD Initiated Contact with Patient 05/26/13 1707     Chief Complaint  Patient presents with  . DVT  . Shortness of Breath   (Consider location/radiation/quality/duration/timing/severity/associated sxs/prior Treatment) HPI Comments: Pt with h/o varicose veins, was to have a procedure to remove by Dr. Jimmie Molly with [facility] today, states she had a severe pain in left medial upper thigh and was concerned about a blood clot in that area.  Pain is mostly resolved at this time.  She also reports a similar pain about 2 weeks ago and endorses some chest tightness and SOB with heavy exertion for several days to a week.  At the clinic, they performed an ultrasound and told her she has a DVT.  Dr. Jimmie Molly gave her prescriptions for both xarelto and coumadin to begin depending on her cost.  He contacted her PCP Dr. Katrinka Blazing who told her to present to the ED due to the SOB.  Pt has no prior h/o DVT or PE.  She denies recent travel . Pain in leg is improved, no current CP or SOB.    Patient is a 62 y.o. female presenting with shortness of breath. The history is provided by the patient.  Shortness of Breath Associated symptoms: no abdominal pain, no cough and no fever     Past Medical History  Diagnosis Date  . Hypertension    History reviewed. No pertinent past surgical history. History reviewed. No pertinent family history. History  Substance Use Topics  . Smoking status: Never Smoker   . Smokeless tobacco: Not on file  . Alcohol Use: Yes     Comment: occ   OB History   Grav Para Term Preterm Abortions TAB SAB Ect Mult Living                 Review of Systems  Constitutional: Negative for fever and chills.  Respiratory: Positive for shortness of breath. Negative for cough.   Cardiovascular: Positive for leg swelling.  Gastrointestinal: Negative for nausea and abdominal pain.  Musculoskeletal: Positive for  arthralgias.  All other systems reviewed and are negative.    Allergies  Norvasc and Latex  Home Medications   Current Outpatient Rx  Name  Route  Sig  Dispense  Refill  . furosemide (LASIX) 20 MG tablet   Oral   Take 20 mg by mouth daily.         Marland Kitchen levothyroxine (SYNTHROID, LEVOTHROID) 100 MCG tablet   Oral   Take 100 mcg by mouth daily before breakfast.          BP 146/79  Pulse 48  Temp(Src) 97.8 F (36.6 C) (Oral)  Resp 16  SpO2 99% Physical Exam  Nursing note and vitals reviewed. Constitutional: She is oriented to person, place, and time. She appears well-developed and well-nourished. No distress.  HENT:  Head: Normocephalic and atraumatic.  Eyes: EOM are normal.  Neck: Normal range of motion. Neck supple.  Cardiovascular: Regular rhythm and intact distal pulses.  Bradycardia present.   No murmur heard. Pulmonary/Chest: Effort normal. No respiratory distress. She has no wheezes.  Abdominal: Soft.  Musculoskeletal: Normal range of motion.       Legs: Neurological: She is alert and oriented to person, place, and time. She exhibits normal muscle tone. Coordination normal.  Skin: Skin is warm. She is not diaphoretic.  Psychiatric: She has a normal mood and affect.  ED Course  Procedures (including critical care time) Labs Review Labs Reviewed  PROTIME-INR  POCT I-STAT, CHEM 8  POCT I-STAT TROPONIN I   Imaging Review Dg Chest 2 View  05/26/2013   EXAM: CHEST  2 VIEW  COMPARISON:  Chest radiograph 06/23/2006 and chest CT 04/18/2005  FINDINGS: Heart size is borderline enlarged and stable. Mediastinal and hilar contours are stable and within normal limits. Pulmonary vascularity is normal. The lungs are not normally expanded and clear. Negative for pleural effusion or pneumothorax. Bilateral breast implants noted. Postsurgical changes of the cervical spine are present. Status post ORIF of the distal right clavicle. 10 mm focal area of sclerosis in the right  humeral head is stable compared to chest radiograph of 2005, consistent with benign etiology such as enchondroma or bone infarct. The bones are diffusely osteopenic. No acute fracture is identified. Upper abdominal bowel gas pattern is normal. Cholecystectomy clips present.  IMPRESSION: 1. No acute cardiopulmonary disease.  2. Borderline cardiomegaly.  3. Osteopenia.   Electronically Signed   By: Britta Mccreedy M.D.   On: 05/26/2013 17:42   Ct Angio Chest W/cm &/or Wo Cm  05/26/2013   CLINICAL DATA:  Right leg DVT with leg swelling for 1 month. Shortness of breath for 2 weeks.  EXAM: CT ANGIOGRAPHY CHEST WITH CONTRAST  TECHNIQUE: Multidetector CT imaging of the chest was performed using the standard protocol during bolus administration of intravenous contrast. Multiplanar CT image reconstructions including MIPs were obtained to evaluate the vascular anatomy.  CONTRAST:  65mL OMNIPAQUE IOHEXOL 350 MG/ML SOLN  COMPARISON:  Radiographs 05/26/2013. Chest CT 04/18/2005 .  FINDINGS: The pulmonary arteries are well opacified with contrast. There is no evidence of acute pulmonary embolism. The lumen of the thoracic aorta is not yet opacified with contrast. Aortic tortuosity appears grossly unchanged. There are no enlarged mediastinal or hilar lymph nodes.  There is no pleural or pericardial effusion . Mild chronic lung disease appears unchanged. There is no confluent airspace opacity or endobronchial lesion.  Partially calcified bilateral breast implants are again noted with progressive capsular collapse on the left. The visualized upper abdomen appears unremarkable. Patient is status post lower cervical fusion and distal right clavicle fixation.  Review of the MIP images confirms the above findings.  IMPRESSION: 1. No evidence of acute pulmonary embolism or other acute chest process. 2. Grossly stable aortic atherosclerosis and chronic lung disease.   Electronically Signed   By: Roxy Horseman M.D.   On: 05/26/2013 20:17      EKG Interpretation     Ventricular Rate:  55 PR Interval:  156 QRS Duration: 90 QT Interval:  446 QTC Calculation: 426 R Axis:   26 Text Interpretation:  Sinus bradycardia RSR' or QR pattern in V1 suggests right ventricular conduction delay ECG OTHERWISE WITHIN NORMAL LIMITS           ra sat is 100% and  Interpret to be normal  8:32 PM Chest CT shows no PE.  Will give xarelto here now, pt has prescription that she can fill tomorrow given to her by her vascular physician and pt can otherwise follow up with PCP next week.   MDM   1. DVT (deep venous thrombosis), left      Pt with confirmed DVT from [facility], records not available in [facility].  Pt's HR is 56 and sats 100% and so I doubt PE, but with pt's symptoms, will get CT of chest, angio to r/o PE. Pt seems stable and asymptomatic  enough to warrant treatment as an outpt with close PCP follow up once on xarelto.      Gavin Pound. Oletta Lamas, MD 05/26/13 2032

## 2013-05-26 NOTE — ED Notes (Signed)
Pt sent here by PCP for eval for + DVT in right leg; pt sts swelling greater than one month; pt sts SOB x 2 weeks

## 2013-05-26 NOTE — ED Provider Notes (Signed)
Needed to correct Xarelto dosing for DVT to 15 mg BID for 21 days than 20 mg daily thereafter   Arman Filter, NP 05/26/13 2126

## 2013-06-14 ENCOUNTER — Ambulatory Visit
Admission: RE | Admit: 2013-06-14 | Discharge: 2013-06-14 | Disposition: A | Discharge status: Home / Self Care | Payer: 59 | Source: Ambulatory Visit | Attending: Family Medicine | Admitting: Family Medicine

## 2013-06-14 ENCOUNTER — Other Ambulatory Visit: Payer: Self-pay | Admitting: Family Medicine

## 2013-06-14 DIAGNOSIS — R198 Other specified symptoms and signs involving the digestive system and abdomen: Secondary | ICD-10-CM

## 2013-06-14 MED ORDER — IOHEXOL 300 MG/ML  SOLN
100.0000 mL | Freq: Once | INTRAMUSCULAR | Status: AC | PRN
  Administered 2013-06-14: 100 mL via INTRAVENOUS

## 2013-08-16 ENCOUNTER — Encounter: Payer: Self-pay | Admitting: Vascular Surgery

## 2013-09-14 ENCOUNTER — Ambulatory Visit (INDEPENDENT_AMBULATORY_CARE_PROVIDER_SITE_OTHER): Payer: BC Managed Care – PPO | Admitting: Vascular Surgery

## 2013-09-14 ENCOUNTER — Encounter: Payer: Self-pay | Admitting: Vascular Surgery

## 2013-09-14 ENCOUNTER — Encounter (INDEPENDENT_AMBULATORY_CARE_PROVIDER_SITE_OTHER): Payer: Self-pay

## 2013-09-14 ENCOUNTER — Other Ambulatory Visit (HOSPITAL_COMMUNITY): Payer: BC Managed Care – PPO

## 2013-09-14 VITALS — BP 161/109 | HR 59 | Resp 18 | Ht 62.0 in | Wt 180.0 lb

## 2013-09-14 DIAGNOSIS — I82409 Acute embolism and thrombosis of unspecified deep veins of unspecified lower extremity: Secondary | ICD-10-CM

## 2013-09-14 NOTE — Progress Notes (Signed)
Patient name: Debbie ApleyBarbara D Walters MRN: [id number] DOB: [birth year] Sex: female   Referred by: Dr. Katrinka BlazingSmith  Reason for referral:  Chief Complaint  Patient presents with  . New Evaluation    left femoral vein DVT  referred by Dr Jimmie MollyGreenberg    HISTORY OF PRESENT ILLNESS: Patient is a very complex prior lower surety venous pathology history. She works as a  Circumstantial prolonged period of time. She's had difficulty with chronic venous insufficiency left much greater than right leg. She had from scler[facility] of her left [facility]. She had progressed progressive trouble with varicosities in her medial thigh and difficulty with heaviness and skin changes in her left medial calf. She had skin changes and was seen by dermatologist to rule out skin cancer was felt to have vascular insufficiency. She was then seen at [facility] by Dr. Jimmie MollyGreenberg. Evaluation at that time suggested persistent reflux in her great saphenous vein was tributary varicosities and perforator incompetence. There had been planned for laser ablation of her great saphenous vein and chemical ablation of reflux and tributaries and incompetent perforators. She presented in October of 2014 with new pain. Duplex at that time showed DVT in her left femoral vein. She was having some shortness of breath and therefore underwent CT scan of the chest which showed no evidence of pulmonary embolus. She was placed onXaralto for anticoagulation do to her DVT. She had other difficulty at the same period of time complaining of abdominal discomfort and underwent EGD and colonoscopy which were reportedly negative. She was recently seen in [facility] again with repeat duplex showing persistent thrombus in her femoral vein. There was no evidence of thrombus in her common femoral vein. There was some suggestion that there had been some advancement in her femoral vein. She also had some  thrombus in her calf veins which was unchanged. She did undergo CT of her abdomen and pelvis November 2014 for workup of her abdominal pain. I reviewed this shows no evidence of significant atherosclerotic change was widely patent mesenteric vessels with no calcification. She does not have any evidence of venous pathology in the pelvis. Her main concerns continued to be concern regarding the skin changes on her medial distal calf with the discomfort over this area. She reports that she continues to have shortness of breath and fatigue as well  Past Medical History  Diagnosis Date  . Hypertension   . Hypothyroidism   . Atrial fibrillation   . Colitis   . DVT (deep venous thrombosis)     Past Surgical History  Procedure Laterality Date  . Abdominal hysterectomy    . Neck surgery    . Shoulder surgery      History   Social History  . Marital Status: Divorced    Spouse Name: N/A    Number of Children: N/A  . Years of Education: N/A   Occupational History  . Not on file.   Social History Main Topics  . Smoking status: Never Smoker   . Smokeless tobacco: Never Used  . Alcohol Use: Yes     Comment: occ  . Drug Use: No  . Sexual Activity: Not on file   Other Topics Concern  . Not on file   Social History Narrative  . No narrative on file    Family History  Problem Relation Age of Onset  . Cancer Mother     pancreatic  . Diabetes  Mother   . Heart disease Mother   . Alcohol abuse Father     Allergies as of 09/14/2013 - Review Complete 09/14/2013  Allergen Reaction Noted  . Losartan Swelling 08/16/2013  . Metoprolol Other (See Comments) 08/16/2013  . Norvasc [amlodipine]  05/26/2013  . Vasotec [enalapril]  08/16/2013  . Latex Rash 05/26/2013    Current Outpatient Prescriptions on File Prior to Visit  Medication Sig Dispense Refill  . furosemide (LASIX) 20 MG tablet Take 20 mg by mouth daily.      Marland Kitchen levothyroxine (SYNTHROID, LEVOTHROID) 100 MCG tablet Take 100 mcg  by mouth daily before breakfast.      . Rivaroxaban (XARELTO) 15 MG TABS tablet Take 1 tablet (15 mg total) by mouth 2 (two) times daily with a meal.  42 tablet  0   No current facility-administered medications on file prior to visit.     REVIEW OF SYSTEMS:  Positives indicated with an "X"  CARDIOVASCULAR:  [ ]  chest pain   [ ]  chest pressure   [ ]  palpitations   [ ]  orthopnea   [x ] dyspnea on exertion   [ ]  claudication   [ ]  rest pain   [x ] DVT   [ ]  phlebitis PULMONARY:   [ ]  productive cough   [ ]  asthma   [x ] wheezing NEUROLOGIC:   [ ]  weakness  [ ]  paresthesias  [ ]  aphasia  [ ]  amaurosis  [ ]  dizziness HEMATOLOGIC:   [ ]  bleeding problems   [ ]  clotting disorders MUSCULOSKELETAL:  [ ]  joint pain   [ ]  joint swelling GASTROINTESTINAL: [ ]   blood in stool  [ ]   hematemesis GENITOURINARY:  [ ]   dysuria  [ ]   hematuria PSYCHIATRIC:  [ ]  history of major depression INTEGUMENTARY:  [ ]  rashes  [ ]  ulcers CONSTITUTIONAL:  [ ]  fever   [ ]  chills  PHYSICAL EXAMINATION:  General: The patient is a well-nourished female, in no acute distress. Vital signs are BP 161/109  Pulse 59  Resp 18  Ht 5\' 2"  (1.575 m)  Wt 180 lb (81.647 kg)  BMI 32.91 kg/m2 Pulmonary: There is a good air exchange bilaterally without wheezing or rales. Abdomen: Soft and non-tender. No masses noted Musculoskeletal: There are no major deformities.  There is no significant extremity pain. Neurologic: No focal weakness or paresthesias are detected, Skin: She does have apparent hemosiderin deposit in the medial aspect of her left calf above her ankle. No open ulceration Psychiatric: The patient has normal affect. Cardiovascular: There is a regular rate and rhythm without significant murmur appreciated. Carotid arteries without bruits bilaterally Pulse status 2+ radial 2+ femoral 2+ popliteal 2+ posterior tibial pulses bilaterally    Impression and Plan:  Had a very long discussion with the patient. Did  explain the significance of her persistent venous hypertension related to superficial and deep venous reflux in her left leg. I certainly would not recommend any superficial ablation treatment. She does not have complete occlusion of her femoral vein but this may cause worsening of her venous hypertension especially do to her acute thrombus within the last 3 months. I did discuss vena cava filter placement. I extended she had a very weak relative indication for this. I would not recommend vena cava filter placement since she has had only ultrasound suggesting some increase in her femoral vein but no extension into the common femoral vein and the distal extension. She is quite concerned regarding the  discomfort associated with this area of her skin changes. I explained that do not feel this is any sign of danger. She has been seen by dermatology to rule out any significant skin pathology. I encouraged her to continue with elevation when possible and compliant use of compression garments. She was reassured this discussion will see [place] again on an as-needed basis. I explained that I would certainly recommend anticoagulation therapy for 6 months at a minimum.    EARLY, TODD [facility] Office: [phone]

## 2013-11-03 ENCOUNTER — Telehealth: Payer: Self-pay | Admitting: *Deleted

## 2013-11-03 NOTE — Telephone Encounter (Signed)
Pt called in regards to her seeing a doctor she saw Dr. Clarene DukeLittle in 2013. She has not been feeling well and wanted to know what she should do. She has blood clots in her legs and sees Dr. Arbie CookeyEarly and has not gotten anywhere with him. She stated that she has SOB and is weak. She also saw Dr. Herbie BaltimoreHarding in 2012.  AL-[facility]

## 2013-11-03 NOTE — Telephone Encounter (Signed)
Returning your call  .. Thanks  °

## 2013-11-03 NOTE — Telephone Encounter (Signed)
Offered appt Friday with Dr. Herbie BaltimoreHarding but that is not a good day for her, she needs a Monday.  Transferred her to a scheduler to work this out.

## 2013-11-15 ENCOUNTER — Telehealth: Payer: Self-pay | Admitting: *Deleted

## 2013-12-03 ENCOUNTER — Telehealth: Payer: Self-pay | Admitting: *Deleted

## 2013-12-03 NOTE — Telephone Encounter (Signed)
Encounter Closed---5/8 TP 

## 2013-12-06 ENCOUNTER — Telehealth: Payer: Self-pay | Admitting: Cardiovascular Disease

## 2013-12-06 NOTE — Telephone Encounter (Signed)
Patient saw PCP today (she's a patient of [facility]) and their office called to get her an appt with Dr. Allyson SabalBerry. She has been having SOB for 2 weeks. THey have done CTs and other tests and are now referring to cardiology.

## 2013-12-06 NOTE — Telephone Encounter (Signed)
Information taken to [place]. She instructed me to have scheduler give appointment tomorrow @ 8:00 am. Debbie CowmanDeedie was given the information.

## 2013-12-07 ENCOUNTER — Other Ambulatory Visit: Payer: Self-pay | Admitting: Cardiovascular Disease

## 2013-12-07 ENCOUNTER — Encounter: Payer: Self-pay | Admitting: Cardiovascular Disease

## 2013-12-07 ENCOUNTER — Ambulatory Visit (INDEPENDENT_AMBULATORY_CARE_PROVIDER_SITE_OTHER): Payer: 59 | Admitting: Cardiovascular Disease

## 2013-12-07 VITALS — BP 150/98 | HR 56 | Ht 62.0 in | Wt 176.0 lb

## 2013-12-07 DIAGNOSIS — I10 Essential (primary) hypertension: Secondary | ICD-10-CM | POA: Insufficient documentation

## 2013-12-07 DIAGNOSIS — R0989 Other specified symptoms and signs involving the circulatory and respiratory systems: Secondary | ICD-10-CM

## 2013-12-07 DIAGNOSIS — R0609 Other forms of dyspnea: Secondary | ICD-10-CM

## 2013-12-07 DIAGNOSIS — R079 Chest pain, unspecified: Secondary | ICD-10-CM

## 2013-12-07 DIAGNOSIS — R0602 Shortness of breath: Secondary | ICD-10-CM

## 2013-12-07 DIAGNOSIS — Z8249 Family history of ischemic heart disease and other diseases of the circulatory system: Secondary | ICD-10-CM | POA: Insufficient documentation

## 2013-12-07 DIAGNOSIS — I82409 Acute embolism and thrombosis of unspecified deep veins of unspecified lower extremity: Secondary | ICD-10-CM

## 2013-12-07 DIAGNOSIS — Z86718 Personal history of other venous thrombosis and embolism: Secondary | ICD-10-CM

## 2013-12-07 LAB — BUN+CREAT: BUN/Creatinine Ratio: 14 Ratio

## 2013-12-07 LAB — BUN: BUN: 13 mg/dL (ref 6–23)

## 2013-12-07 LAB — CREATININE, SERUM: Creat: 0.93 mg/dL (ref 0.50–1.10)

## 2013-12-07 NOTE — Assessment & Plan Note (Signed)
Patient has had progressive accelerating dyspnea on exertion of the left several months and particularly over the last 2 weeks. Additionally she has paroxysmal nocturnal dyspnea and orthopnea. She has had 2 normal cardiac catheterizations in the past, one 10 years ago by Dr. Clarene DukeLittle and 1 years ago by myself. She's had a CT of performed in October of last year that was negative for pulmonary embolus. In concerned about the progressive nature of her symptoms. I'm going to get a 2-D echo, pharmacologic Myoview stress test as was repeat the CT angiogram of her chest to rule out chronic primary embolus given her left lower extremity DVT.

## 2013-12-07 NOTE — Patient Instructions (Signed)
  We will see you back in follow up after the tests.  Dr Allyson SabalBerry has ordered : 1.  Echocardiogram. Echocardiography is a painless test that uses sound waves to create images of your heart. It provides your doctor with information about the size and shape of your heart and how well your heart's chambers and valves are working. This procedure takes approximately one hour. There are no restrictions for this procedure.   2. Lexiscan Myoview- this is a test that looks at the blood flow to your heart muscle.  It takes approximately 2 1/2 hours. Please follow instruction sheet, as given.   3. .CT of your chest to make sure that you do not have a blood clot in your lungs. (Pulmonary Embolism)  4. Your physician has requested that you have a lower venous duplex. This test is an ultrasound of the veins in the legs. It looks at venous blood flow that carries blood from the heart to the legs or arms. Allow one hour for a Lower Venous exam. There are no restrictions or special instructions.

## 2013-12-07 NOTE — Assessment & Plan Note (Signed)
The patient is on Xarelto oral anticoagulation. She has a history of left lower extremity DVT and has been evaluated by Dr. Gretta Beganodd Early. She does have chronic venous stasis changes on the left side. I'm going to recheck lower extremity venous Doppler studies

## 2013-12-07 NOTE — Assessment & Plan Note (Signed)
Poorly controlled. She has been on antihypertensive medications the past and has had side effects to several of these including calcium channel blockers which caused profound lower extremity edema. Renal function is normal. We may try her on an ACE inhibitor or ARB. She was on losartan in the past.

## 2013-12-07 NOTE — Progress Notes (Signed)
12/07/2013 Debbie ApleyBarbara D Regas   1950-10-27  [id number]  Primary Physician Allean FoundSMITH,CANDACE THIELE, MD Primary Cardiologist: Runell GessJonathan J. Berry MD Roseanne RenoFACP,FACC,FAHA, FSCAI   HPI:  Debbie Walters is a very pleasant 63 year old mildly overweight divorced Caucasian female mother of 3 children, and mother of 5 children who is a Interior and spatial designerhairdresser. She was remotely a patient of Dr. Selmer DominionAl Littles. She was referred by Dr. Merri Brunetteandace Smith for cardiovascular evaluation because of progressive dyspnea on exertion. Her cardiac risk factors include hypertension and strong family history of heart disease with her mother and multiple siblings who have died of heart attacks. She's never had a heart microscope. She's had 2 negative cardiac catheterizations performed 10 years ago and 3 years ago. She has had left lower extremity DVT on oral anticoagulation and has been seen by Dr. Tawanna Coolerodd Early in the past. Since the diagnosis of a DVT she's had progressive dyspnea on exertion which has been more noticeable over the last several weeks associated with orthopnea and PND. She did have a negative CT exam of her chest in October of last year ruling out pulmonary embolus at that time.   Current Outpatient Prescriptions  Medication Sig Dispense Refill  . furosemide (LASIX) 20 MG tablet Take 20 mg by mouth daily.      Marland Kitchen. levothyroxine (SYNTHROID, LEVOTHROID) 100 MCG tablet Take 100 mcg by mouth daily before breakfast.      . rivaroxaban (XARELTO) 20 MG TABS tablet Take 20 mg by mouth daily.       No current facility-administered medications for this visit.    Allergies  Allergen Reactions  . Losartan Swelling  . Metoprolol Other (See Comments)    bradycardia  . Norvasc [Amlodipine]     Shut kidneys down  . Vasotec [Enalapril]     Had heart issues  . Latex Rash    History   Social History  . Marital Status: Divorced    Spouse Name: N/A    Number of Children: N/A  . Years of Education: N/A   Occupational History  . Not on  file.   Social History Main Topics  . Smoking status: Never Smoker   . Smokeless tobacco: Never Used  . Alcohol Use: Yes     Comment: occ  . Drug Use: No  . Sexual Activity: Not on file   Other Topics Concern  . Not on file   Social History Narrative  . No narrative on file     Review of Systems: General: negative for chills, fever, night sweats or weight changes.  Cardiovascular: negative for chest pain, dyspnea on exertion, edema, orthopnea, palpitations, paroxysmal nocturnal dyspnea or shortness of breath Dermatological: negative for rash Respiratory: negative for cough or wheezing Urologic: negative for hematuria Abdominal: negative for nausea, vomiting, diarrhea, bright red blood per rectum, melena, or hematemesis Neurologic: negative for visual changes, syncope, or dizziness All other systems reviewed and are otherwise negative except as noted above.    Blood pressure 150/98, pulse 56, height 5\' 2"  (1.575 m), weight 176 lb (79.833 kg).  General appearance: alert and no distress Neck: no adenopathy, no carotid bruit, no JVD, supple, symmetrical, trachea midline and thyroid not enlarged, symmetric, no tenderness/mass/nodules Lungs: clear to auscultation bilaterally Heart: regular rate and rhythm, S1, S2 normal, no murmur, click, rub or gallop Extremities: extremities normal, atraumatic, no cyanosis or edema  EKG sinus bradycardia at 56 with nonspecific ST and T wave changes  ASSESSMENT AND PLAN:   DVT (deep venous thrombosis) The  patient is on Xarelto oral anticoagulation. She has a history of left lower extremity DVT and has been evaluated by Dr. Gretta Beganodd Early. She does have chronic venous stasis changes on the left side. I'm going to recheck lower extremity venous Doppler studies  Dyspnea on exertion Patient has had progressive accelerating dyspnea on exertion of the left several months and particularly over the last 2 weeks. Additionally she has paroxysmal nocturnal  dyspnea and orthopnea. She has had 2 normal cardiac catheterizations in the past, one 10 years ago by Dr. Clarene DukeLittle and 1 years ago by myself. She's had a CT of performed in October of last year that was negative for pulmonary embolus. In concerned about the progressive nature of her symptoms. I'm going to get a 2-D echo, pharmacologic Myoview stress test as was repeat the CT angiogram of her chest to rule out chronic primary embolus given her left lower extremity DVT.  Essential hypertension Poorly controlled. She has been on antihypertensive medications the past and has had side effects to several of these including calcium channel blockers which caused profound lower extremity edema. Renal function is normal. We may try her on an ACE inhibitor or ARB. She was on losartan in the past.      Runell GessJonathan J. Berry MD [facility] CenterFACP,FACC,FAHA, [facility] AFSCAI 12/07/2013 9:03 AM

## 2013-12-08 ENCOUNTER — Telehealth (HOSPITAL_COMMUNITY): Payer: Self-pay

## 2013-12-09 ENCOUNTER — Telehealth (HOSPITAL_COMMUNITY): Payer: Self-pay

## 2013-12-13 ENCOUNTER — Ambulatory Visit (HOSPITAL_BASED_OUTPATIENT_CLINIC_OR_DEPARTMENT_OTHER)
Admission: RE | Admit: 2013-12-13 | Discharge: 2013-12-13 | Disposition: A | Discharge status: Home / Self Care | Payer: 59 | Source: Ambulatory Visit | Attending: Cardiovascular Disease | Admitting: Cardiovascular Disease

## 2013-12-13 ENCOUNTER — Inpatient Hospital Stay: Admission: RE | Admit: 2013-12-13 | Payer: 59 | Source: Ambulatory Visit

## 2013-12-13 ENCOUNTER — Ambulatory Visit (HOSPITAL_COMMUNITY)
Admission: RE | Admit: 2013-12-13 | Discharge: 2013-12-13 | Disposition: A | Discharge status: Home / Self Care | Payer: 59 | Source: Ambulatory Visit | Attending: Cardiovascular Disease | Admitting: Cardiovascular Disease

## 2013-12-13 ENCOUNTER — Telehealth: Payer: Self-pay | Admitting: Cardiovascular Disease

## 2013-12-13 DIAGNOSIS — I369 Nonrheumatic tricuspid valve disorder, unspecified: Secondary | ICD-10-CM

## 2013-12-13 DIAGNOSIS — Z86718 Personal history of other venous thrombosis and embolism: Secondary | ICD-10-CM | POA: Insufficient documentation

## 2013-12-13 DIAGNOSIS — R0602 Shortness of breath: Secondary | ICD-10-CM

## 2013-12-13 DIAGNOSIS — I803 Phlebitis and thrombophlebitis of lower extremities, unspecified: Secondary | ICD-10-CM

## 2013-12-13 NOTE — Progress Notes (Signed)
2D Echocardiogram Complete.  12/13/2013   Sherleen Pangborn, RDCS 

## 2013-12-13 NOTE — Telephone Encounter (Signed)
Patient is here getting some tests done. She stated that she had labs done last Monday and would like her results today. Thanks.

## 2013-12-13 NOTE — Progress Notes (Signed)
Bilateral Venous Duplex Completed. No evidence of acute DVT noted. Debbie Walters, BS, RDMS, RVT

## 2013-12-13 NOTE — Telephone Encounter (Signed)
SPOKE TO PATIENT.  RESULTS ARE NORMAL. THE RESULTS ARE ON MY CHART

## 2013-12-13 NOTE — Telephone Encounter (Signed)
I spoke with Debbie Walters about rescheduling CTA chest with and without contrast   PE protocol.  (We had to cancel original appointment for insurance reasons.)   Patient wants to wait and get stress test results before she reschedules.

## 2013-12-14 ENCOUNTER — Ambulatory Visit (HOSPITAL_COMMUNITY)
Admission: RE | Admit: 2013-12-14 | Discharge: 2013-12-14 | Disposition: A | Discharge status: Home / Self Care | Payer: 59 | Source: Ambulatory Visit | Attending: Internal Medicine | Admitting: Internal Medicine

## 2013-12-14 DIAGNOSIS — R0602 Shortness of breath: Secondary | ICD-10-CM

## 2013-12-14 DIAGNOSIS — R079 Chest pain, unspecified: Secondary | ICD-10-CM

## 2013-12-14 MED ORDER — TECHNETIUM TC 99M SESTAMIBI GENERIC - CARDIOLITE
10.3000 | Freq: Once | INTRAVENOUS | Status: AC | PRN
  Administered 2013-12-14: 10 via INTRAVENOUS

## 2013-12-14 MED ORDER — REGADENOSON 0.4 MG/5ML IV SOLN
0.4000 mg | Freq: Once | INTRAVENOUS | Status: AC
  Administered 2013-12-14: 0.4 mg via INTRAVENOUS

## 2013-12-14 MED ORDER — TECHNETIUM TC 99M SESTAMIBI GENERIC - CARDIOLITE
30.2000 | Freq: Once | INTRAVENOUS | Status: AC | PRN
  Administered 2013-12-14: 30.2 via INTRAVENOUS

## 2013-12-14 MED ORDER — AMINOPHYLLINE 25 MG/ML IV SOLN
75.0000 mg | Freq: Once | INTRAVENOUS | Status: AC
  Administered 2013-12-14: 75 mg via INTRAVENOUS

## 2013-12-14 NOTE — Procedures (Addendum)
[facility] [place] [facility] [address] [place] [address] [phone]  Cardiology Nuclear Med Study  Lorie ApleyBarbara D Boyers is a 63 y.o. female     MRN : [id number]     DOB: [birth year]  Procedure Date: 12/14/2013  Nuclear Med Background Indication for Stress Test:  Evaluation for Ischemia History:  No prior cardiac history, no prior nuclear MPI study Cardiac Risk Factors: Family History - CAD, Hypertension and Overweight  Symptoms:  Chest Pain, DOE and Fatigue   Nuclear Pre-Procedure Caffeine/Decaff Intake:  7:00pm NPO After: 5:00am   IV Site: R Antecubital  IV 0.9% NS with Angio Cath:  22g  Chest Size (in):  n/a IV Started by: Koren Shiverobin Moffitt, CNMT  Height:    Cup Size: 40D  BMI:  There is no weight on file to calculate BMI. Weight:      Tech Comments:  n/a    Nuclear Med Study 1 or 2 day study: 1 day  Stress Test Type:  Lexiscan  Order Authorizing Provider:  Nanetta BattyJonathan Berry, MD   Resting Radionuclide: Technetium 3915m Sestamibi  Resting Radionuclide Dose: 10.3 mCi   Stress Radionuclide:  Technetium 5315m Sestamibi  Stress Radionuclide Dose: 30.2 mCi           Stress Protocol Rest HR: 51 Stress HR: 77  Rest BP: 141/98 Stress BP: 151/90  Exercise Time (min): n/a METS: n/a   Predicted Max HR: 158 bpm % Max HR: 48.73 bpm Rate Pressure Product: [id number]  Dose of Adenosine (mg):  n/a Dose of Lexiscan: 0.4 mg  Dose of Atropine (mg): n/a Dose of Dobutamine: n/a mcg/kg/min (at max HR)  Stress Test Technologist: Esperanza Sheetserry-Marie Martin, CCT Nuclear Technologist: Gonzella LexPam Phillips, CNMT   Rest Procedure:  Myocardial perfusion imaging was performed at rest 45 minutes following the intravenous administration of Technetium 7315m Sestamibi. Stress Procedure:  The patient received IV Lexiscan 0.4 mg over 15-seconds.  Technetium 6115m Sestamibi injected at 30-seconds. Patient experienced SOB and headache and 75 mg Aminophylline IV was administered at 5 minutes.  There were no significant changes with Lexiscan.  Quantitative spect images were obtained after a 45 minute delay.  Transient Ischemic Dilatation (Normal <1.22):  1.11 Lung/Heart Ratio (Normal <0.45):  0.31 QGS EDV:  66 ml QGS ESV:  21 ml LV Ejection Fraction: 69%    Rest ECG: Sinus Bradycardia at 51  Stress ECG: No significant change from baseline ECG  QPS Raw Data Images:  Normal; no motion artifact; normal heart/lung ratio. Stress Images:  Normal homogeneous uptake in all areas of the myocardium. Rest Images:  Normal homogeneous uptake in all areas of the myocardium. Subtraction (SDS):  Normal  Impression Exercise Capacity:  Lexiscan with no exercise. BP Response:  Normal blood pressure response. Clinical Symptoms:  No significant symptoms noted. ECG Impression:  No significant ST segment change suggestive of ischemia. Comparison with Prior Nuclear Study: No images to compare  Overall Impression:  Normal stress nuclear study.  LV Wall Motion:  NL LV Function, EF 69%; NL Wall Motion   Lennette Biharihomas A Laurelin Elson, MD  12/14/2013 11:32 AM

## 2013-12-20 ENCOUNTER — Encounter: Payer: Self-pay | Admitting: *Deleted

## 2013-12-22 ENCOUNTER — Other Ambulatory Visit: Payer: Self-pay | Admitting: Family Medicine

## 2013-12-22 DIAGNOSIS — I872 Venous insufficiency (chronic) (peripheral): Secondary | ICD-10-CM

## 2013-12-23 ENCOUNTER — Telehealth: Payer: Self-pay | Admitting: Cardiovascular Disease

## 2013-12-23 NOTE — Telephone Encounter (Signed)
Spoke with patient and gaveher the informatin for her appointment for CTA CHEST PE Protocol----scheduled for Monday 12/27/13 @ 9:30 am----arrive 9:15 am for registration at [facility].  Clear liquids only 4 hrs prior---morning medications are ok.  Patient voiced understanding.

## 2013-12-23 NOTE — Telephone Encounter (Signed)
I called patient to inform her that we had received authorization from her insurance for chest cta to r/o PE that was ordered by Dr. Allyson Sabal.  She states that her other test results were negative and wants to know if she still needs to have this done.

## 2013-12-23 NOTE — Telephone Encounter (Signed)
RN spoke with patient.  RN informed patient , yes she should have CT done , the other test she had do not indicate PE., but it is her option to have it or not. She states okay ,and when was it scheduled. RN transferred to scheduler Joyce Gross.

## 2013-12-27 ENCOUNTER — Inpatient Hospital Stay: Admission: RE | Admit: 2013-12-27 | Payer: 59 | Source: Ambulatory Visit

## 2014-01-25 ENCOUNTER — Ambulatory Visit: Payer: 59 | Admitting: Cardiovascular Disease

## 2014-01-27 NOTE — Telephone Encounter (Signed)
Encounter complete. 

## 2014-02-09 NOTE — Telephone Encounter (Signed)
Encounter complete. 

## 2014-12-07 ENCOUNTER — Encounter: Payer: Self-pay | Admitting: *Deleted

## 2016-02-01 ENCOUNTER — Institutional Professional Consult (permissible substitution): Payer: Self-pay | Admitting: Internal Medicine

## 2016-03-11 ENCOUNTER — Ambulatory Visit (INDEPENDENT_AMBULATORY_CARE_PROVIDER_SITE_OTHER)
Admission: RE | Admit: 2016-03-11 | Discharge: 2016-03-11 | Disposition: A | Discharge status: Home / Self Care | Payer: Medicare Other | Source: Ambulatory Visit | Attending: Internal Medicine | Admitting: Internal Medicine

## 2016-03-11 ENCOUNTER — Encounter: Payer: Self-pay | Admitting: Internal Medicine

## 2016-03-11 ENCOUNTER — Ambulatory Visit (INDEPENDENT_AMBULATORY_CARE_PROVIDER_SITE_OTHER): Payer: Medicare Other | Admitting: Internal Medicine

## 2016-03-11 VITALS — BP 138/76 | HR 60 | Ht 62.0 in | Wt 167.8 lb

## 2016-03-11 DIAGNOSIS — E039 Hypothyroidism, unspecified: Secondary | ICD-10-CM | POA: Diagnosis not present

## 2016-03-11 DIAGNOSIS — I10 Essential (primary) hypertension: Secondary | ICD-10-CM | POA: Diagnosis not present

## 2016-03-11 DIAGNOSIS — R079 Chest pain, unspecified: Secondary | ICD-10-CM | POA: Diagnosis not present

## 2016-03-11 DIAGNOSIS — R001 Bradycardia, unspecified: Secondary | ICD-10-CM | POA: Diagnosis not present

## 2016-03-11 DIAGNOSIS — R0609 Other forms of dyspnea: Secondary | ICD-10-CM

## 2016-03-11 DIAGNOSIS — R0602 Shortness of breath: Secondary | ICD-10-CM | POA: Diagnosis not present

## 2016-03-11 MED ORDER — FAMOTIDINE 20 MG PO TABS: ORAL_TABLET | ORAL | Status: DC

## 2016-03-11 NOTE — Progress Notes (Signed)
lmtcb for pt.  

## 2016-03-11 NOTE — Patient Instructions (Addendum)
Pantoprazole (protonix) 40 mg   Take  30-60 min before first meal of the day and Pepcid (famotidine)  20 mg one @  bedtime until return to office - this is the best way to tell whether stomach acid is contributing to your problem.    GERD (REFLUX)  is an extremely common cause of respiratory symptoms just like yours , many times with no obvious heartburn at all.    It can be treated with medication, but also with lifestyle changes including elevation of the head of your bed (ideally with 6 inch  bed blocks),  Smoking cessation, avoidance of late meals, excessive alcohol, and avoid fatty foods, chocolate, peppermint, colas, red wine, and acidic juices such as orange juice.  NO MINT OR MENTHOL PRODUCTS SO NO COUGH DROPS   USE SUGARLESS CANDY INSTEAD (Jolley ranchers or Stover's or Life Savers) or even ice chips will also do - the key is to swallow to prevent all throat clearing. NO OIL BASED VITAMINS - use powdered substitutes.    Please remember to go to the  x-ray department downstairs for your tests - we will call you with the results when they are available.  Please see patient coordinator before you leave today  to schedule CPST in 2 weeks no sooner

## 2016-03-11 NOTE — Progress Notes (Signed)
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   03/11/2016 1st [facility] office visit/ Timia Casselman   Chief Complaint  Patient presents with  . Advice Only    Referred by Dr. Katrinka BlazingSmith for SOB.  s/s worsening X2 years.   cp/tightness x years  always  radiates to jaw and back occurs at rest and makes her lose her breath but never occurs noct Every other day has some sob s cp but it is not reproducible  Except happens every time she carries groceries to house Walking x half mile nl pace worse doe on hills Once every 2-3 weeks  Sob even at rest resolves w/in a few min s rx  Sometimes sob assoc with sense of palpitations  No obvious day to day or daytime variability or assoc excess/ purulent sputum or mucus plugs or hemoptysis or cp or chest tightness, subjective wheeze or overt sinus or hb symptoms. No unusual exp hx or h/o childhood pna/ asthma or knowledge of premature birth.  Sleeping ok without nocturnal  or early am exacerbation  of respiratory  c/o's or need for noct saba. Also denies any obvious fluctuation of symptoms with weather or environmental changes or other aggravating or alleviating factors except as outlined above   Current Medications, Allergies, Complete Past Medical History, Past Surgical History, Family History, and Social History were reviewed in [facility] electronic medical record.   Review of Systems  Constitutional: Negative for fever and unexpected weight change.  HENT: Negative for congestion, dental problem, ear pain,  nosebleeds, postnasal drip, rhinorrhea, sinus pressure, sneezing, sore throat and trouble swallowing.   Eyes: Negative for redness and itching.  Respiratory: Positive for chest tightness, shortness of breath and wheezing. Negative for cough.   Cardiovascular: Negative for palpitations and leg swelling.  Gastrointestinal: Negative for nausea and vomiting.  Genitourinary: Negative for dysuria.  Musculoskeletal: Negative for joint swelling.  Skin: Negative for rash.  Neurological: Negative for headaches.  Hematological: Does not bruise/bleed easily.  Psychiatric/Behavioral: Negative for dysphoric mood. The patient is not nervous/anxious.        Objective:   Physical Exam  amb wf nad chewing mint gum  Wt Readings from Last 3 Encounters:  03/11/16 167 lb 12.8 oz (76.1 kg)  12/07/13 176 lb (79.8 kg)  09/14/13 180 lb (81.6 kg)    Vital signs reviewed   HEENT: nl dentition, turbinates, and oropharynx. Nl external ear canals without cough reflex   NECK :  without JVD/Nodes/TM/ nl carotid upstrokes bilaterally   LUNGS: no acc muscle use,  Nl contour chest which is clear to A and P bilaterally without cough on insp or exp maneuvers   CV:  RRR  no s3 or murmur or increase in P2, no edema   ABD:  soft and nontender with nl inspiratory excursion in the supine position. No bruits or organomegaly, bowel sounds nl  MS:  Nl gait/ ext warm without deformities, calf tenderness, cyanosis or clubbing No obvious  joint restrictions   SKIN: warm and dry without lesions    NEURO:  alert, approp, nl sensorium with  no motor deficits      CXR PA and Lateral:   03/11/2016 :    I personally reviewed images and agree with radiology impression as follows:   No active cardiopulmonary disease.  Pt reports labs per Dr Katrinka BlazingSmith nl in last month or two      Assessment & Plan:

## 2016-03-12 NOTE — Assessment & Plan Note (Addendum)
12/14/13   Neg myocardial perfusion study  03/11/2016  Walked RA x 3 laps @ 185 ft each stopped due to end of study, fast pace, no sob  Chronic and variable Symptoms are markedly disproportionate to objective findings and not clear this is a lung problem with ddx:    almost all start with A and  include Adherence, Ace Inhibitors, Acid Reflux, Active Sinus Disease, Alpha 1 Antitripsin deficiency, Anxiety masquerading as Airways dz,  ABPA,  Allergy(esp in young), Aspiration (esp in elderly), Adverse effects of meds,  Active smokers, A bunch of PE's (a small clot burden can't cause this syndrome unless there is already severe underlying pulm or vascular dz with poor reserve) plus two Bs  = Bronchiectasis and Beta blocker use..and one C= CHF  Adherence is always the initial "prime suspect" and is a multilayered concern that requires a "trust but verify" approach in every patient - starting with knowing how to use medications, especially inhalers, correctly, keeping up with refills and understanding the fundamental difference between maintenance and prns vs those medications only taken for a very short course and then stopped and not refilled.   ? Acid (or non-acid) GERD > always difficult to exclude as up to 75% of pts in some series report no assoc GI/ Heartburn symptoms though note she had a nl EGD in 06/2013 rec max (24h)  acid suppression and diet restrictions (no  reviewed and instructions given in writing.    ? Allergy/asthma very unlikely s cough or noct symptoms or obvious upper airway symptoms to suggest underlying rhinitis  ? A bunch of PE's extremely unlikely to cause non-reproducible sob though note remote h/o dvt s PE in 2014   ? Anxiety > usually at the bottom of this list of usual suspects but should be much higher on this pt's based on H and P > Follow up per Primary Care planned    ? Chf/IHD very unlikely based on previous w/u while symptomatic  Best way to sort out her symptoms is  cpst while on max rx for GERD and if neg then consider rx for anxiety per PC   Total time devoted to counseling  = 35/8170m review case with pt/ discussion of options/alternatives/ personally creating written instructions  in presence of pt  then going over those specific  Instructions directly with the pt including how to use all of the meds but in particular covering each new medication in detail and the difference between the maintenance/automatic meds and the prns using an action plan format for the latter.

## 2016-03-18 NOTE — Progress Notes (Signed)
lmtcb

## 2016-03-19 ENCOUNTER — Encounter: Payer: Self-pay | Admitting: *Deleted

## 2016-03-19 NOTE — Progress Notes (Signed)
Letter mailed

## 2016-03-20 ENCOUNTER — Telehealth: Payer: Self-pay | Admitting: Internal Medicine

## 2016-03-20 NOTE — Telephone Encounter (Signed)
Notes Recorded by Nyoka CowdenMichael B Wert, MD on 03/11/2016 at 1:29 PM EDT Call pt: Reviewed cxr and no acute change so [facility]  Spoke with pt and notified of results per Dr. Sherene SiresWert. Pt verbalized understanding and denied any questions.

## 2016-03-25 ENCOUNTER — Ambulatory Visit (HOSPITAL_COMMUNITY): Payer: Medicare Other | Attending: Cardiology

## 2016-03-25 DIAGNOSIS — I10 Essential (primary) hypertension: Secondary | ICD-10-CM | POA: Diagnosis not present

## 2016-03-25 DIAGNOSIS — R0609 Other forms of dyspnea: Secondary | ICD-10-CM | POA: Diagnosis not present

## 2016-03-25 DIAGNOSIS — E039 Hypothyroidism, unspecified: Secondary | ICD-10-CM | POA: Diagnosis not present

## 2016-03-26 DIAGNOSIS — R0600 Dyspnea, unspecified: Secondary | ICD-10-CM | POA: Diagnosis not present

## 2016-03-26 NOTE — Progress Notes (Signed)
LMTCB

## 2016-03-27 ENCOUNTER — Telehealth: Payer: Self-pay | Admitting: Internal Medicine

## 2016-03-27 NOTE — Telephone Encounter (Signed)
Spoke with pt. She is aware of results. OV has been scheduled for 04/22/16 at 9:30am. Nothing further was needed.

## 2016-03-27 NOTE — Telephone Encounter (Signed)
Patient called states returning Leslie's call -pr

## 2016-03-27 NOTE — Telephone Encounter (Signed)
Nyoka CowdenMichael B Wert, MD  Christen ButterLeslie M Raskin, CMA        Call patient : Study is significant for hbp during the exercise but despite this did very well and reached predicted work out put - would like to set up f/u ov in next 4-6 weeks to review in more detail and come up with a plan to help with ex sob   ---  LMTCB x1

## 2016-03-27 NOTE — Telephone Encounter (Signed)
Patient returned call - pr  °

## 2016-04-22 ENCOUNTER — Ambulatory Visit: Payer: Medicare Other | Admitting: Internal Medicine

## 2016-05-06 DIAGNOSIS — R609 Edema, unspecified: Secondary | ICD-10-CM | POA: Diagnosis not present

## 2016-05-06 DIAGNOSIS — E039 Hypothyroidism, unspecified: Secondary | ICD-10-CM | POA: Diagnosis not present

## 2016-05-06 DIAGNOSIS — I10 Essential (primary) hypertension: Secondary | ICD-10-CM | POA: Diagnosis not present

## 2016-07-15 DIAGNOSIS — E039 Hypothyroidism, unspecified: Secondary | ICD-10-CM | POA: Diagnosis not present

## 2016-07-15 DIAGNOSIS — I10 Essential (primary) hypertension: Secondary | ICD-10-CM | POA: Diagnosis not present

## 2016-07-15 DIAGNOSIS — R0602 Shortness of breath: Secondary | ICD-10-CM | POA: Diagnosis not present

## 2016-09-23 DIAGNOSIS — E039 Hypothyroidism, unspecified: Secondary | ICD-10-CM | POA: Diagnosis not present

## 2016-12-16 DIAGNOSIS — R946 Abnormal results of thyroid function studies: Secondary | ICD-10-CM | POA: Diagnosis not present

## 2017-01-25 DIAGNOSIS — S81802A Unspecified open wound, left lower leg, initial encounter: Secondary | ICD-10-CM | POA: Diagnosis not present

## 2017-01-25 DIAGNOSIS — L03116 Cellulitis of left lower limb: Secondary | ICD-10-CM | POA: Diagnosis not present

## 2017-01-28 DIAGNOSIS — L03116 Cellulitis of left lower limb: Secondary | ICD-10-CM | POA: Diagnosis not present

## 2017-02-01 DIAGNOSIS — L089 Local infection of the skin and subcutaneous tissue, unspecified: Secondary | ICD-10-CM | POA: Diagnosis not present

## 2017-03-06 DIAGNOSIS — E162 Hypoglycemia, unspecified: Secondary | ICD-10-CM | POA: Diagnosis not present

## 2017-03-06 DIAGNOSIS — E039 Hypothyroidism, unspecified: Secondary | ICD-10-CM | POA: Diagnosis not present

## 2017-03-13 DIAGNOSIS — L03116 Cellulitis of left lower limb: Secondary | ICD-10-CM | POA: Diagnosis not present

## 2017-04-02 ENCOUNTER — Encounter (HOSPITAL_BASED_OUTPATIENT_CLINIC_OR_DEPARTMENT_OTHER): Payer: Medicare Other | Attending: Surgery

## 2017-04-02 ENCOUNTER — Encounter (HOSPITAL_BASED_OUTPATIENT_CLINIC_OR_DEPARTMENT_OTHER): Payer: Self-pay

## 2017-04-02 DIAGNOSIS — E039 Hypothyroidism, unspecified: Secondary | ICD-10-CM | POA: Diagnosis not present

## 2017-04-02 DIAGNOSIS — Z86718 Personal history of other venous thrombosis and embolism: Secondary | ICD-10-CM | POA: Insufficient documentation

## 2017-04-02 DIAGNOSIS — L97322 Non-pressure chronic ulcer of left ankle with fat layer exposed: Secondary | ICD-10-CM | POA: Diagnosis not present

## 2017-04-02 DIAGNOSIS — I890 Lymphedema, not elsewhere classified: Secondary | ICD-10-CM | POA: Diagnosis not present

## 2017-04-02 DIAGNOSIS — Z79899 Other long term (current) drug therapy: Secondary | ICD-10-CM | POA: Insufficient documentation

## 2017-04-02 DIAGNOSIS — I87312 Chronic venous hypertension (idiopathic) with ulcer of left lower extremity: Secondary | ICD-10-CM | POA: Diagnosis not present

## 2017-04-02 DIAGNOSIS — I10 Essential (primary) hypertension: Secondary | ICD-10-CM | POA: Diagnosis not present

## 2017-04-07 ENCOUNTER — Other Ambulatory Visit: Payer: Self-pay | Admitting: Surgery

## 2017-04-07 ENCOUNTER — Ambulatory Visit (HOSPITAL_COMMUNITY)
Admission: RE | Admit: 2017-04-07 | Discharge: 2017-04-07 | Disposition: A | Discharge status: Home / Self Care | Payer: Medicare Other | Source: Ambulatory Visit | Attending: Surgery | Admitting: Surgery

## 2017-04-07 DIAGNOSIS — Z9889 Other specified postprocedural states: Secondary | ICD-10-CM | POA: Diagnosis not present

## 2017-04-07 DIAGNOSIS — S91302A Unspecified open wound, left foot, initial encounter: Secondary | ICD-10-CM | POA: Diagnosis not present

## 2017-04-07 DIAGNOSIS — I8393 Asymptomatic varicose veins of bilateral lower extremities: Secondary | ICD-10-CM | POA: Insufficient documentation

## 2017-04-08 DIAGNOSIS — L97322 Non-pressure chronic ulcer of left ankle with fat layer exposed: Secondary | ICD-10-CM | POA: Diagnosis not present

## 2017-04-08 DIAGNOSIS — E039 Hypothyroidism, unspecified: Secondary | ICD-10-CM | POA: Diagnosis not present

## 2017-04-08 DIAGNOSIS — I890 Lymphedema, not elsewhere classified: Secondary | ICD-10-CM | POA: Diagnosis not present

## 2017-04-08 DIAGNOSIS — Z86718 Personal history of other venous thrombosis and embolism: Secondary | ICD-10-CM | POA: Diagnosis not present

## 2017-04-08 DIAGNOSIS — I87312 Chronic venous hypertension (idiopathic) with ulcer of left lower extremity: Secondary | ICD-10-CM | POA: Diagnosis not present

## 2017-04-08 DIAGNOSIS — I10 Essential (primary) hypertension: Secondary | ICD-10-CM | POA: Diagnosis not present

## 2017-04-08 DIAGNOSIS — S91002A Unspecified open wound, left ankle, initial encounter: Secondary | ICD-10-CM | POA: Diagnosis not present

## 2017-04-11 ENCOUNTER — Other Ambulatory Visit: Payer: Self-pay | Admitting: Surgery

## 2017-04-11 DIAGNOSIS — S91302A Unspecified open wound, left foot, initial encounter: Secondary | ICD-10-CM

## 2017-04-14 ENCOUNTER — Ambulatory Visit (HOSPITAL_COMMUNITY): Admission: RE | Admit: 2017-04-14 | Payer: Medicare Other | Source: Ambulatory Visit

## 2017-04-14 ENCOUNTER — Ambulatory Visit (HOSPITAL_COMMUNITY)
Admission: RE | Admit: 2017-04-14 | Discharge: 2017-04-14 | Disposition: A | Discharge status: Home / Self Care | Payer: Medicare Other | Source: Ambulatory Visit | Attending: Surgery | Admitting: Surgery

## 2017-04-14 ENCOUNTER — Encounter (HOSPITAL_COMMUNITY): Payer: Self-pay

## 2017-04-14 DIAGNOSIS — S91302A Unspecified open wound, left foot, initial encounter: Secondary | ICD-10-CM

## 2017-04-14 DIAGNOSIS — L97329 Non-pressure chronic ulcer of left ankle with unspecified severity: Secondary | ICD-10-CM | POA: Insufficient documentation

## 2017-04-14 DIAGNOSIS — R9439 Abnormal result of other cardiovascular function study: Secondary | ICD-10-CM | POA: Diagnosis not present

## 2017-04-16 DIAGNOSIS — S86022A Laceration of left Achilles tendon, initial encounter: Secondary | ICD-10-CM | POA: Diagnosis not present

## 2017-04-16 DIAGNOSIS — I87312 Chronic venous hypertension (idiopathic) with ulcer of left lower extremity: Secondary | ICD-10-CM | POA: Diagnosis not present

## 2017-04-16 DIAGNOSIS — E039 Hypothyroidism, unspecified: Secondary | ICD-10-CM | POA: Diagnosis not present

## 2017-04-16 DIAGNOSIS — I10 Essential (primary) hypertension: Secondary | ICD-10-CM | POA: Diagnosis not present

## 2017-04-16 DIAGNOSIS — I890 Lymphedema, not elsewhere classified: Secondary | ICD-10-CM | POA: Diagnosis not present

## 2017-04-16 DIAGNOSIS — L97322 Non-pressure chronic ulcer of left ankle with fat layer exposed: Secondary | ICD-10-CM | POA: Diagnosis not present

## 2017-04-16 DIAGNOSIS — Z86718 Personal history of other venous thrombosis and embolism: Secondary | ICD-10-CM | POA: Diagnosis not present

## 2017-04-21 DIAGNOSIS — H04123 Dry eye syndrome of bilateral lacrimal glands: Secondary | ICD-10-CM | POA: Diagnosis not present

## 2017-04-21 DIAGNOSIS — H5212 Myopia, left eye: Secondary | ICD-10-CM | POA: Diagnosis not present

## 2017-04-21 DIAGNOSIS — H2513 Age-related nuclear cataract, bilateral: Secondary | ICD-10-CM | POA: Diagnosis not present

## 2017-04-23 DIAGNOSIS — I890 Lymphedema, not elsewhere classified: Secondary | ICD-10-CM | POA: Diagnosis not present

## 2017-04-23 DIAGNOSIS — I87312 Chronic venous hypertension (idiopathic) with ulcer of left lower extremity: Secondary | ICD-10-CM | POA: Diagnosis not present

## 2017-04-23 DIAGNOSIS — S91002A Unspecified open wound, left ankle, initial encounter: Secondary | ICD-10-CM | POA: Diagnosis not present

## 2017-04-23 DIAGNOSIS — L97322 Non-pressure chronic ulcer of left ankle with fat layer exposed: Secondary | ICD-10-CM | POA: Diagnosis not present

## 2017-04-23 DIAGNOSIS — I10 Essential (primary) hypertension: Secondary | ICD-10-CM | POA: Diagnosis not present

## 2017-04-23 DIAGNOSIS — Z86718 Personal history of other venous thrombosis and embolism: Secondary | ICD-10-CM | POA: Diagnosis not present

## 2017-04-23 DIAGNOSIS — E039 Hypothyroidism, unspecified: Secondary | ICD-10-CM | POA: Diagnosis not present

## 2017-04-30 ENCOUNTER — Encounter (HOSPITAL_BASED_OUTPATIENT_CLINIC_OR_DEPARTMENT_OTHER): Payer: Medicare Other | Attending: Surgery

## 2017-04-30 DIAGNOSIS — I10 Essential (primary) hypertension: Secondary | ICD-10-CM | POA: Insufficient documentation

## 2017-04-30 DIAGNOSIS — L97322 Non-pressure chronic ulcer of left ankle with fat layer exposed: Secondary | ICD-10-CM | POA: Diagnosis not present

## 2017-04-30 DIAGNOSIS — I87312 Chronic venous hypertension (idiopathic) with ulcer of left lower extremity: Secondary | ICD-10-CM | POA: Diagnosis not present

## 2017-04-30 DIAGNOSIS — Z86718 Personal history of other venous thrombosis and embolism: Secondary | ICD-10-CM | POA: Insufficient documentation

## 2017-04-30 DIAGNOSIS — I890 Lymphedema, not elsewhere classified: Secondary | ICD-10-CM | POA: Diagnosis not present

## 2017-05-06 DIAGNOSIS — L97329 Non-pressure chronic ulcer of left ankle with unspecified severity: Secondary | ICD-10-CM | POA: Diagnosis not present

## 2017-05-06 DIAGNOSIS — I890 Lymphedema, not elsewhere classified: Secondary | ICD-10-CM | POA: Diagnosis not present

## 2017-05-06 DIAGNOSIS — I10 Essential (primary) hypertension: Secondary | ICD-10-CM | POA: Diagnosis not present

## 2017-05-06 DIAGNOSIS — I87312 Chronic venous hypertension (idiopathic) with ulcer of left lower extremity: Secondary | ICD-10-CM | POA: Diagnosis not present

## 2017-05-06 DIAGNOSIS — L97322 Non-pressure chronic ulcer of left ankle with fat layer exposed: Secondary | ICD-10-CM | POA: Diagnosis not present

## 2017-05-06 DIAGNOSIS — Z86718 Personal history of other venous thrombosis and embolism: Secondary | ICD-10-CM | POA: Diagnosis not present

## 2017-05-12 DIAGNOSIS — E039 Hypothyroidism, unspecified: Secondary | ICD-10-CM | POA: Diagnosis not present

## 2017-05-14 ENCOUNTER — Encounter: Payer: Self-pay | Admitting: Vascular Surgery

## 2017-05-14 ENCOUNTER — Ambulatory Visit (INDEPENDENT_AMBULATORY_CARE_PROVIDER_SITE_OTHER): Payer: Medicare Other | Admitting: Vascular Surgery

## 2017-05-14 VITALS — BP 131/72 | HR 58 | Temp 97.8°F | Resp 14 | Ht 62.0 in | Wt 163.0 lb

## 2017-05-14 DIAGNOSIS — I872 Venous insufficiency (chronic) (peripheral): Secondary | ICD-10-CM | POA: Diagnosis not present

## 2017-05-14 DIAGNOSIS — I890 Lymphedema, not elsewhere classified: Secondary | ICD-10-CM | POA: Diagnosis not present

## 2017-05-14 DIAGNOSIS — S91002A Unspecified open wound, left ankle, initial encounter: Secondary | ICD-10-CM | POA: Diagnosis not present

## 2017-05-14 DIAGNOSIS — I10 Essential (primary) hypertension: Secondary | ICD-10-CM | POA: Diagnosis not present

## 2017-05-14 DIAGNOSIS — L97322 Non-pressure chronic ulcer of left ankle with fat layer exposed: Secondary | ICD-10-CM | POA: Diagnosis not present

## 2017-05-14 DIAGNOSIS — Z86718 Personal history of other venous thrombosis and embolism: Secondary | ICD-10-CM | POA: Diagnosis not present

## 2017-05-14 DIAGNOSIS — I87312 Chronic venous hypertension (idiopathic) with ulcer of left lower extremity: Secondary | ICD-10-CM | POA: Diagnosis not present

## 2017-05-14 NOTE — Progress Notes (Signed)
Patient name: Debbie Walters MRN: [id number] DOB: 09-Sep-1950 Sex: female   REASON FOR CONSULT:    Nonhealing wound. The consult is requested by Dr. Meyer RusselBritto.   HPI:   Debbie ApleyBarbara D Koppen is a pleasant 66 y.o. female,  who injured her left Achilles in June of this year.A large object fell on her leg and she was left with a fairly extensive wound. She has been followed in the wound care center and now the wound has almost completely healed.  I reviewed the record that was sent from the wound care center. The patient was seen for a wound of the left posterior ankle. The patient was seen on 04/16/2017 and at that point then had the wound for over 3 months. The wound began after an injury on 01/17/2017. The patient has been treated with antibiotics. The patient does have a history ofvenous thrombosis and peripheral edema with chronic venous insufficiency. The patient had a venous procedure done by [facility] about 3 years ago. Doppler studies in September showed no evidence of arterial disease. ABIs were 100% bilaterally. A venous duplex scan at that time showed reflux in the right great saphenous vein and saphenofemoral junction. The left leg showed incompetence of the left great saphenous vein. Vascular surgery was consult is based on these findings.  I also reviewed the note from our office on 09/14/2013. The patient had been referred with a left femoral DVT by Dr. Jimmie MollyGreenberg. At that time she had persistent venous hypertension related to superficial and deep venous reflux in the left leg. No further intervention wasn't recommended at that time. He was on Coumadin for 9 months after this DVT in her left leg.  She denies any history of claudication or rest pain. She does get some pain in her feet at night but says that it held straight elevator legs. 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  Past Medical History:  Diagnosis Date  . Atrial fibrillation (HCC)   . Colitis   .  DVT (deep venous thrombosis) (HCC)   . Hypertension   . Hypothyroidism   . Morton neuroma   . Shortness of breath     Family History  Problem Relation Age of Onset  . Cancer Mother        pancreatic  . Diabetes Mother   . Heart disease Mother   . Alcohol abuse Father   . Heart attack Sister   . Heart attack Brother     SOCIAL HISTORY: Social History   Social History  . Marital status: Divorced    Spouse name: N/A  . Number of children: N/A  . Years of education: N/A   Occupational History  . Not on file.   Social History Main Topics  . Smoking status: Never Smoker  . Smokeless tobacco: Never Used  . Alcohol use Yes     Comment: occ  . Drug use: No  . Sexual activity: Not on file   Other Topics Concern  . Not on file   Social History Narrative  . No narrative on file    Allergies  Allergen Reactions  . Latex Rash  . Losartan Swelling and Other (See Comments)    Mimic heart attack  . Metoprolol Other (See Comments)    bradycardia bradycardia  . Norvasc [Amlodipine]     Shut kidneys down  . Vasotec [Enalapril]     Had heart issues    Current Outpatient Prescriptions  Medication  Sig Dispense Refill  . furosemide (LASIX) 20 MG tablet Take 20 mg by mouth daily.    Marland Kitchen levothyroxine (SYNTHROID, LEVOTHROID) 100 MCG tablet Take 100 mcg by mouth daily before breakfast.    . ramipril (ALTACE) 10 MG capsule Take 10 mg by mouth daily.    . famotidine (PEPCID) 20 MG tablet One at bedtime (Patient not taking: Reported on 05/14/2017)    . pantoprazole (PROTONIX) 40 MG tablet Take 40 mg by mouth daily.     No current facility-administered medications for this visit.     REVIEW OF SYSTEMS:  [X]  denotes positive finding, [ ]  denotes negative finding Cardiac  Comments:  Chest pain or chest pressure:    Shortness of breath upon exertion:    Short of breath when lying flat:    Irregular heart rhythm:        Vascular    Pain in calf, thigh, or hip brought on by  ambulation:    Pain in feet at night that wakes you up from your sleep:  X   Blood clot in your veins:    Leg swelling:  X       Pulmonary    Oxygen at home:    Productive cough:     Wheezing:         Neurologic    Sudden weakness in arms or legs:     Sudden numbness in arms or legs:     Sudden onset of difficulty speaking or slurred speech:    Temporary loss of vision in one eye:     Problems with dizziness:         Gastrointestinal    Blood in stool:     Vomited blood:         Genitourinary    Burning when urinating:     Blood in urine:        Psychiatric    Major depression:         Hematologic    Bleeding problems:    Problems with blood clotting too easily:        Skin    Rashes or ulcers:        Constitutional    Fever or chills:     PHYSICAL EXAM:   Vitals:   05/14/17 1223  BP: 131/72  Pulse: (!) 58  Resp: 14  Temp: 97.8 F (36.6 C)  SpO2: 100%  Weight: 163 lb (73.9 kg)  Height: 5\' 2"  (1.575 m)    GENERAL: The patient is a well-nourished female, in no acute distress. The vital signs are documented above. CARDIAC: There is a regular rate and rhythm.  VASCULAR: I did not detect carotid bruits. On the left side, which is the site of concern, she has a palpable femoral, popliteal, and posterior tibial pulse. On the right side, she has a palpable femoral and popliteal pulse. I cannot palpate pedal pulses. Both feet are warm and well perfused. She has hyperpigmentation bilaterally. Currently she has only mild bilateral lower extremity swelling. PULMONARY: There is good air exchange bilaterally without wheezing or rales. ABDOMEN: Soft and non-tender with normal pitched bowel sounds.  MUSCULOSKELETAL: There are no major deformities or cyanosis. NEUROLOGIC: No focal weakness or paresthesias are detected. SKIN: there is a small wound over her left Achilles which is almost completely healed at this point. It measures 3 mm in width by 20 mm in  length. PSYCHIATRIC: The patient has a normal affect.  DATA:    ARTERIAL DOPPLER  STUDY: I reviewed the arterial Doppler study that was done on 04/14/2017.  On the left side, which is the side with the ulcer, there is a triphasic dorsalis pedis signal and a triphasic posterior tibial signal. ABI is 100%. Toe pressure on the left is 76 mmHg.  On the right side, there is a triphasic dorsalis pedis and posterior tibial signal. ABI is 100%. Toe pressure on the right is 67 mmHg.  VENOUS REFLUX STUDY: I reviewed the venous reflux study that was doneon 04/07/2017.  On the left side, which is the side that had the ulcer, there was no evidence of DVT or superficial thrombophlebitis. There was deep venous reflux in th common femoral vein. There was  Reflux in the left great saphenous vein.  On the right side there was no evidence of DVT or superficial thrombophlebitis. There was deep venous reflux involving the common femoral vein.There was some reflux and an anterior sensory branch of the saphenous vein on the right.  MEDICAL ISSUES:   CHRONIC VENOUS INSUFFICIENCY: This patient as had a previous DVT of the left lower extremity and also foam ablation of the saphenous vein on the left. Based on her reflux study and did not think she is a candidate for any type of an ablation procedure on the left. We have discussed the importance of intermittent leg elevation in the proper positioning for this. She wears compression stockings with a gradient of 40 mmHg. I have encouraged her to avoid prolonged sitting and standing. I encouraged her to exercise and to consider water aerobics. We've also discussed the importance of keeping her skin well lubricated. I reassured her that she has no evidence of arterial insufficiency. I'll be happy to see her back in the future if any new vascular issues arise.  Waverly Ferrari [facility] [phone]

## 2017-06-03 ENCOUNTER — Other Ambulatory Visit: Payer: Self-pay | Admitting: Family Medicine

## 2017-06-03 DIAGNOSIS — Z1239 Encounter for other screening for malignant neoplasm of breast: Secondary | ICD-10-CM

## 2017-06-13 ENCOUNTER — Ambulatory Visit (INDEPENDENT_AMBULATORY_CARE_PROVIDER_SITE_OTHER): Payer: Medicare Other

## 2017-06-13 DIAGNOSIS — Z1231 Encounter for screening mammogram for malignant neoplasm of breast: Secondary | ICD-10-CM | POA: Diagnosis not present

## 2017-06-13 DIAGNOSIS — Z1239 Encounter for other screening for malignant neoplasm of breast: Secondary | ICD-10-CM

## 2017-07-16 DIAGNOSIS — J209 Acute bronchitis, unspecified: Secondary | ICD-10-CM | POA: Diagnosis not present

## 2017-08-18 DIAGNOSIS — R062 Wheezing: Secondary | ICD-10-CM | POA: Diagnosis not present

## 2017-08-18 DIAGNOSIS — R7301 Impaired fasting glucose: Secondary | ICD-10-CM | POA: Diagnosis not present

## 2017-08-18 DIAGNOSIS — E039 Hypothyroidism, unspecified: Secondary | ICD-10-CM | POA: Diagnosis not present

## 2017-08-18 DIAGNOSIS — Z23 Encounter for immunization: Secondary | ICD-10-CM | POA: Diagnosis not present

## 2017-08-18 DIAGNOSIS — I10 Essential (primary) hypertension: Secondary | ICD-10-CM | POA: Diagnosis not present

## 2017-10-30 ENCOUNTER — Other Ambulatory Visit: Payer: Self-pay | Admitting: Plastic Surgery

## 2017-10-30 DIAGNOSIS — N644 Mastodynia: Secondary | ICD-10-CM

## 2017-11-03 ENCOUNTER — Ambulatory Visit
Admission: RE | Admit: 2017-11-03 | Discharge: 2017-11-03 | Disposition: A | Discharge status: Home / Self Care | Payer: Medicare Other | Source: Ambulatory Visit | Attending: Plastic Surgery | Admitting: Plastic Surgery

## 2017-11-03 ENCOUNTER — Other Ambulatory Visit: Payer: Self-pay | Admitting: Plastic Surgery

## 2017-11-03 DIAGNOSIS — N644 Mastodynia: Secondary | ICD-10-CM | POA: Diagnosis not present

## 2017-11-03 DIAGNOSIS — R928 Other abnormal and inconclusive findings on diagnostic imaging of breast: Secondary | ICD-10-CM

## 2017-11-10 ENCOUNTER — Ambulatory Visit
Admission: RE | Admit: 2017-11-10 | Discharge: 2017-11-10 | Disposition: A | Discharge status: Home / Self Care | Payer: Medicare Other | Source: Ambulatory Visit | Attending: Plastic Surgery | Admitting: Plastic Surgery

## 2017-11-10 ENCOUNTER — Other Ambulatory Visit (HOSPITAL_COMMUNITY)
Admission: RE | Admit: 2017-11-10 | Discharge: 2017-11-10 | Disposition: A | Discharge status: Home / Self Care | Payer: Medicare Other | Source: Ambulatory Visit | Attending: Diagnostic Radiology | Admitting: Diagnostic Radiology

## 2017-11-10 DIAGNOSIS — R898 Other abnormal findings in specimens from other organs, systems and tissues: Secondary | ICD-10-CM | POA: Diagnosis not present

## 2017-11-10 DIAGNOSIS — T8543XA Leakage of breast prosthesis and implant, initial encounter: Secondary | ICD-10-CM | POA: Diagnosis not present

## 2017-11-10 DIAGNOSIS — R928 Other abnormal and inconclusive findings on diagnostic imaging of breast: Secondary | ICD-10-CM

## 2017-11-10 DIAGNOSIS — N6002 Solitary cyst of left breast: Secondary | ICD-10-CM | POA: Diagnosis not present

## 2017-12-01 DIAGNOSIS — T8544XD Capsular contracture of breast implant, subsequent encounter: Secondary | ICD-10-CM | POA: Diagnosis not present

## 2017-12-01 DIAGNOSIS — N6489 Other specified disorders of breast: Secondary | ICD-10-CM | POA: Diagnosis not present

## 2017-12-02 ENCOUNTER — Other Ambulatory Visit: Payer: Self-pay | Admitting: Plastic Surgery

## 2017-12-02 DIAGNOSIS — N611 Abscess of the breast and nipple: Secondary | ICD-10-CM

## 2017-12-04 ENCOUNTER — Other Ambulatory Visit: Payer: Medicare Other

## 2017-12-09 ENCOUNTER — Ambulatory Visit
Admission: RE | Admit: 2017-12-09 | Discharge: 2017-12-09 | Disposition: A | Discharge status: Home / Self Care | Payer: Medicare Other | Source: Ambulatory Visit | Attending: Plastic Surgery | Admitting: Plastic Surgery

## 2017-12-09 DIAGNOSIS — N611 Abscess of the breast and nipple: Secondary | ICD-10-CM

## 2017-12-09 DIAGNOSIS — N644 Mastodynia: Secondary | ICD-10-CM | POA: Diagnosis not present

## 2017-12-09 DIAGNOSIS — N6489 Other specified disorders of breast: Secondary | ICD-10-CM | POA: Diagnosis not present

## 2017-12-15 DIAGNOSIS — T8544XD Capsular contracture of breast implant, subsequent encounter: Secondary | ICD-10-CM | POA: Diagnosis not present

## 2017-12-15 DIAGNOSIS — N6489 Other specified disorders of breast: Secondary | ICD-10-CM | POA: Diagnosis not present

## 2017-12-30 DIAGNOSIS — N390 Urinary tract infection, site not specified: Secondary | ICD-10-CM | POA: Diagnosis not present

## 2018-01-12 ENCOUNTER — Other Ambulatory Visit: Payer: Self-pay | Admitting: Plastic Surgery

## 2018-01-12 DIAGNOSIS — N6320 Unspecified lump in the left breast, unspecified quadrant: Secondary | ICD-10-CM

## 2018-01-19 ENCOUNTER — Ambulatory Visit
Admission: RE | Admit: 2018-01-19 | Discharge: 2018-01-19 | Disposition: A | Discharge status: Home / Self Care | Payer: Medicare Other | Source: Ambulatory Visit | Attending: Plastic Surgery | Admitting: Plastic Surgery

## 2018-01-19 DIAGNOSIS — N6320 Unspecified lump in the left breast, unspecified quadrant: Secondary | ICD-10-CM

## 2018-01-19 DIAGNOSIS — N611 Abscess of the breast and nipple: Secondary | ICD-10-CM | POA: Diagnosis not present

## 2018-01-19 DIAGNOSIS — N6489 Other specified disorders of breast: Secondary | ICD-10-CM | POA: Diagnosis not present

## 2018-02-04 DIAGNOSIS — N6489 Other specified disorders of breast: Secondary | ICD-10-CM | POA: Diagnosis not present

## 2018-02-16 DIAGNOSIS — I10 Essential (primary) hypertension: Secondary | ICD-10-CM | POA: Diagnosis not present

## 2018-02-16 DIAGNOSIS — E2839 Other primary ovarian failure: Secondary | ICD-10-CM | POA: Diagnosis not present

## 2018-02-16 DIAGNOSIS — R7303 Prediabetes: Secondary | ICD-10-CM | POA: Diagnosis not present

## 2018-02-16 DIAGNOSIS — Z1389 Encounter for screening for other disorder: Secondary | ICD-10-CM | POA: Diagnosis not present

## 2018-02-16 DIAGNOSIS — Z0000 Encounter for general adult medical examination without abnormal findings: Secondary | ICD-10-CM | POA: Diagnosis not present

## 2018-02-16 DIAGNOSIS — Z23 Encounter for immunization: Secondary | ICD-10-CM | POA: Diagnosis not present

## 2018-02-16 DIAGNOSIS — R609 Edema, unspecified: Secondary | ICD-10-CM | POA: Diagnosis not present

## 2018-02-16 DIAGNOSIS — E039 Hypothyroidism, unspecified: Secondary | ICD-10-CM | POA: Diagnosis not present

## 2018-02-23 DIAGNOSIS — M8588 Other specified disorders of bone density and structure, other site: Secondary | ICD-10-CM | POA: Diagnosis not present

## 2018-02-23 DIAGNOSIS — E2839 Other primary ovarian failure: Secondary | ICD-10-CM | POA: Diagnosis not present

## 2018-03-09 ENCOUNTER — Other Ambulatory Visit: Payer: Self-pay | Admitting: Plastic Surgery

## 2018-03-09 DIAGNOSIS — N6002 Solitary cyst of left breast: Secondary | ICD-10-CM

## 2018-03-11 DIAGNOSIS — M85852 Other specified disorders of bone density and structure, left thigh: Secondary | ICD-10-CM | POA: Diagnosis not present

## 2018-03-12 ENCOUNTER — Other Ambulatory Visit: Payer: Medicare Other

## 2018-03-16 ENCOUNTER — Ambulatory Visit
Admission: RE | Admit: 2018-03-16 | Discharge: 2018-03-16 | Disposition: A | Discharge status: Home / Self Care | Payer: Medicare Other | Source: Ambulatory Visit | Attending: Plastic Surgery | Admitting: Plastic Surgery

## 2018-03-16 DIAGNOSIS — N611 Abscess of the breast and nipple: Secondary | ICD-10-CM | POA: Diagnosis not present

## 2018-03-16 DIAGNOSIS — N6002 Solitary cyst of left breast: Secondary | ICD-10-CM

## 2018-03-16 DIAGNOSIS — N6489 Other specified disorders of breast: Secondary | ICD-10-CM | POA: Diagnosis not present

## 2018-04-06 DIAGNOSIS — N6082 Other benign mammary dysplasias of left breast: Secondary | ICD-10-CM | POA: Diagnosis not present

## 2018-04-06 DIAGNOSIS — N6081 Other benign mammary dysplasias of right breast: Secondary | ICD-10-CM | POA: Diagnosis not present

## 2018-09-14 DIAGNOSIS — R609 Edema, unspecified: Secondary | ICD-10-CM | POA: Diagnosis not present

## 2018-09-14 DIAGNOSIS — R7303 Prediabetes: Secondary | ICD-10-CM | POA: Diagnosis not present

## 2018-09-14 DIAGNOSIS — E039 Hypothyroidism, unspecified: Secondary | ICD-10-CM | POA: Diagnosis not present

## 2018-09-14 DIAGNOSIS — I10 Essential (primary) hypertension: Secondary | ICD-10-CM | POA: Diagnosis not present

## 2018-09-14 DIAGNOSIS — R0609 Other forms of dyspnea: Secondary | ICD-10-CM | POA: Diagnosis not present

## 2018-10-19 ENCOUNTER — Ambulatory Visit: Payer: Medicare Other | Admitting: Internal Medicine

## 2019-03-11 ENCOUNTER — Telehealth: Payer: Self-pay | Admitting: Internal Medicine

## 2019-03-12 NOTE — Telephone Encounter (Signed)
Will f/u with pt

## 2019-03-29 ENCOUNTER — Ambulatory Visit (INDEPENDENT_AMBULATORY_CARE_PROVIDER_SITE_OTHER): Payer: Medicare Other

## 2019-03-29 ENCOUNTER — Encounter: Payer: Self-pay | Admitting: Emergency Medicine

## 2019-03-29 ENCOUNTER — Ambulatory Visit (INDEPENDENT_AMBULATORY_CARE_PROVIDER_SITE_OTHER): Payer: Medicare Other | Admitting: Emergency Medicine

## 2019-03-29 ENCOUNTER — Other Ambulatory Visit: Payer: Self-pay

## 2019-03-29 VITALS — BP 136/80 | HR 69 | Ht 62.0 in | Wt 168.0 lb

## 2019-03-29 DIAGNOSIS — R0609 Other forms of dyspnea: Secondary | ICD-10-CM

## 2019-03-29 MED ORDER — PANTOPRAZOLE SODIUM 40 MG PO TBEC: 40.0000 mg | DELAYED_RELEASE_TABLET | Freq: Every day | ORAL | 5 refills | Status: AC

## 2019-03-29 NOTE — Progress Notes (Signed)
Subjective:    Patient ID: Debbie Walters, female    DOB: 02-11-1951, 68 y.o.   MRN: [id number]  HPI 68 year old never smoker with a history of atrial fibrillation, DVT, hypertension.  She has a hx of childhood asthma that improved / resolved into her teens. She is referred today for further evaluation of exertional dyspnea.  She has been followed by Drs. Little and Ellyn Hack in cardiology with reassuring evaluation.  She was also seen here by Dr. Melvyn Novas, prompted CPEX as below.   She describes dyspnea with exertion, happens with shopping, carrying groceries. She has to stop. Hears noise, ? Stridor - others can hear it too. She has had to curtail most activities.  She can have this happen w some exposures - works as a Theme park manager. She is on ramipril. She has lost some wt - 10 lbs over 1 months. She gets daily mid sternal CP to her back, not currently on PPI. Some cough when she has these episodes.   Cardiopulmonary exercise test on 03/25/2016 reviewed by me, shows pretest spirometry with mixed obstruction and restriction.  No evidence for bronchospasm post exercise.  She did reach goal exercise with normal functional capacity, had some associated hypertension.  Appeared to have combined ventilatory limitation and circulatory limitation.  TSH and CBC have been fine per pt's report - I don't have these available right now.     Review of Systems  Constitutional: Negative for fever and unexpected weight change.  HENT: Negative for congestion, dental problem, ear pain, nosebleeds, postnasal drip, rhinorrhea, sinus pressure, sneezing, sore throat and trouble swallowing.   Eyes: Negative for redness and itching.  Respiratory: Positive for cough, chest tightness, shortness of breath and wheezing.   Cardiovascular: Negative for palpitations and leg swelling.  Gastrointestinal: Negative for nausea and vomiting.  Genitourinary: Negative for dysuria.  Musculoskeletal: Negative for joint swelling.   Skin: Negative for rash.  Neurological: Negative for headaches.  Hematological: Does not bruise/bleed easily.  Psychiatric/Behavioral: Negative for dysphoric mood. The patient is not nervous/anxious.      Past Medical History:  Diagnosis Date  . Atrial fibrillation ([place])   . Colitis   . DVT (deep venous thrombosis) ([place])   . Hypertension   . Hypothyroidism   . Morton neuroma   . Shortness of breath      Family History  Problem Relation Age of Onset  . Cancer Mother        pancreatic  . Diabetes Mother   . Heart disease Mother   . Alcohol abuse Father   . Heart attack Sister   . Heart attack Brother      Social History   Socioeconomic History  . Marital status: Divorced    Spouse name: Not on file  . Number of children: Not on file  . Years of education: Not on file  . Highest education level: Not on file  Occupational History  . Not on file  Social Needs  . Financial resource strain: Not on file  . Food insecurity    Worry: Not on file    Inability: Not on file  . Transportation needs    Medical: Not on file    Non-medical: Not on file  Tobacco Use  . Smoking status: Never Smoker  . Smokeless tobacco: Never Used  Substance and Sexual Activity  . Alcohol use: Yes    Comment: occ  . Drug use: No  . Sexual activity: Not on file  Lifestyle  . Physical activity  Days per week: Not on file    Minutes per session: Not on file  . Stress: Not on file  Relationships  . Social Musician on phone: Not on file    Gets together: Not on file    Attends religious service: Not on file    Active member of club or organization: Not on file    Attends meetings of clubs or organizations: Not on file    Relationship status: Not on file  . Intimate partner violence    Fear of current or ex partner: Not on file    Emotionally abused: Not on file    Physically abused: Not on file    Forced sexual activity: Not on file  Other Topics Concern  . Not on file   Social History Narrative  . Not on file     Allergies  Allergen Reactions  . Latex Rash  . Losartan Swelling and Other (See Comments)    Mimic heart attack  . Metoprolol Other (See Comments)    bradycardia bradycardia  . Norvasc [Amlodipine]     Shut kidneys down  . Vasotec [Enalapril]     Had heart issues     Outpatient Medications Prior to Visit  Medication Sig Dispense Refill  . albuterol (VENTOLIN HFA) 108 (90 Base) MCG/ACT inhaler Inhale 2 puffs into the lungs every 6 (six) hours as needed for wheezing or shortness of breath.    . furosemide (LASIX) 20 MG tablet Take 20 mg by mouth daily.    Marland Kitchen levothyroxine (SYNTHROID) 125 MCG tablet Take 125 mcg by mouth daily before breakfast.    . levothyroxine (SYNTHROID, LEVOTHROID) 100 MCG tablet Take 100 mcg by mouth daily before breakfast.    . ramipril (ALTACE) 10 MG capsule Take 10 mg by mouth daily.     No facility-administered medications prior to visit.         Objective:   Physical Exam Vitals:   03/29/19 1057  BP: 136/80  Pulse: 69  SpO2: 99%  Weight: 168 lb (76.2 kg)  Height: 5\' 2"  (1.575 m)   Gen: Pleasant, overwt woman, in no distress,  normal affect  ENT: No lesions,  mouth clear,  oropharynx clear, no postnasal drip  Neck: No JVD, no stridor  Lungs: No use of accessory muscles, no crackles or wheezing on normal respiration, no wheeze on forced expiration  Cardiovascular: RRR, heart sounds normal, no murmur or gallops, no peripheral edema  Musculoskeletal: No deformities, no cyanosis or clubbing  Neuro: alert, awake, non focal  Skin: Warm, no lesions or rash     Assessment & Plan:  Dyspnea on exertion Based on her description I suspect component of upper airway obstruction, stridor.  Possibly with superimposed lower airways obstruction.  This was suggested by some possible coexisting obstruction with the restriction seen on her spirometry before her cardiopulmonary exercise test.  She also had some  apparent blood pressure related limitation, possibly diastolic dysfunction.  With regard to the upper airway issues she has symptoms consistent with GERD and this could be a contributor.  Also she is on an ACE inhibitor which could be contributing.  She is had a hard time finding an adequate blood pressure regimen and I do not want to change the ramipril yet.  We may need to discuss going forward.  We will treat the GERD empirically to see if she gets benefit in both her chest pain and her upper airway symptoms.  She needs formal  pulmonary function testing.  Chest x-ray today.  If she has true lower airways obstruction that she might benefit from bronchodilator therapy.  Levy Pupaobert Landers Prajapati, MD, PhD 03/29/2019, 1:27 PM [facility] [phone] or if no answer [phone]

## 2019-03-29 NOTE — Assessment & Plan Note (Signed)
Based on her description I suspect component of upper airway obstruction, stridor.  Possibly with superimposed lower airways obstruction.  This was suggested by some possible coexisting obstruction with the restriction seen on her spirometry before her cardiopulmonary exercise test.  She also had some apparent blood pressure related limitation, possibly diastolic dysfunction.  With regard to the upper airway issues she has symptoms consistent with GERD and this could be a contributor.  Also she is on an ACE inhibitor which could be contributing.  She is had a hard time finding an adequate blood pressure regimen and I do not want to change the ramipril yet.  We may need to discuss going forward.  We will treat the GERD empirically to see if she gets benefit in both her chest pain and her upper airway symptoms.  She needs formal pulmonary function testing.  Chest x-ray today.  If she has true lower airways obstruction that she might benefit from bronchodilator therapy.

## 2019-03-29 NOTE — Patient Instructions (Addendum)
We will perform full pulmonary function testing Chest x-ray today Please start pantoprazole 40 mg once daily.  Take this medication 1 hour around food. Depending on how your breathing progresses we may need to consider contribution of ramipril to your upper airway wheezing.  We can talk about this more as we go forward. Follow with Dr Lamonte Sakai in 1 month with full PFT same day.

## 2019-04-06 ENCOUNTER — Other Ambulatory Visit: Payer: Self-pay | Admitting: Plastic Surgery

## 2019-04-06 DIAGNOSIS — Z1231 Encounter for screening mammogram for malignant neoplasm of breast: Secondary | ICD-10-CM

## 2019-04-12 ENCOUNTER — Encounter (HOSPITAL_BASED_OUTPATIENT_CLINIC_OR_DEPARTMENT_OTHER): Payer: Self-pay

## 2019-04-12 ENCOUNTER — Other Ambulatory Visit: Payer: Self-pay

## 2019-04-12 ENCOUNTER — Ambulatory Visit (HOSPITAL_BASED_OUTPATIENT_CLINIC_OR_DEPARTMENT_OTHER)
Admission: RE | Admit: 2019-04-12 | Discharge: 2019-04-12 | Disposition: A | Discharge status: Home / Self Care | Payer: Medicare Other | Source: Ambulatory Visit | Attending: Plastic Surgery | Admitting: Plastic Surgery

## 2019-04-12 ENCOUNTER — Other Ambulatory Visit: Payer: Self-pay | Admitting: Plastic Surgery

## 2019-04-12 DIAGNOSIS — Z1231 Encounter for screening mammogram for malignant neoplasm of breast: Secondary | ICD-10-CM | POA: Diagnosis not present

## 2019-04-13 ENCOUNTER — Other Ambulatory Visit: Payer: Self-pay | Admitting: Plastic Surgery

## 2019-04-13 DIAGNOSIS — R928 Other abnormal and inconclusive findings on diagnostic imaging of breast: Secondary | ICD-10-CM

## 2019-04-16 ENCOUNTER — Ambulatory Visit
Admission: RE | Admit: 2019-04-16 | Discharge: 2019-04-16 | Disposition: A | Discharge status: Home / Self Care | Payer: Medicare Other | Source: Ambulatory Visit | Attending: Plastic Surgery | Admitting: Plastic Surgery

## 2019-04-16 ENCOUNTER — Other Ambulatory Visit: Payer: Self-pay

## 2019-04-16 DIAGNOSIS — R928 Other abnormal and inconclusive findings on diagnostic imaging of breast: Secondary | ICD-10-CM

## 2019-04-16 DIAGNOSIS — N6489 Other specified disorders of breast: Secondary | ICD-10-CM | POA: Diagnosis not present

## 2019-05-04 ENCOUNTER — Other Ambulatory Visit: Payer: Self-pay | Admitting: *Deleted

## 2019-05-07 ENCOUNTER — Other Ambulatory Visit (HOSPITAL_COMMUNITY): Payer: Medicare Other

## 2019-05-07 ENCOUNTER — Other Ambulatory Visit (HOSPITAL_COMMUNITY)
Admission: RE | Admit: 2019-05-07 | Discharge: 2019-05-07 | Disposition: A | Discharge status: Home / Self Care | Payer: Medicare Other | Source: Ambulatory Visit | Attending: Emergency Medicine | Admitting: Emergency Medicine

## 2019-05-07 DIAGNOSIS — Z20828 Contact with and (suspected) exposure to other viral communicable diseases: Secondary | ICD-10-CM | POA: Insufficient documentation

## 2019-05-07 DIAGNOSIS — Z01812 Encounter for preprocedural laboratory examination: Secondary | ICD-10-CM | POA: Diagnosis not present

## 2019-05-10 ENCOUNTER — Ambulatory Visit (INDEPENDENT_AMBULATORY_CARE_PROVIDER_SITE_OTHER): Payer: Medicare Other | Admitting: Emergency Medicine

## 2019-05-10 ENCOUNTER — Other Ambulatory Visit: Payer: Self-pay

## 2019-05-10 ENCOUNTER — Encounter: Payer: Self-pay | Admitting: Emergency Medicine

## 2019-05-10 DIAGNOSIS — R0600 Dyspnea, unspecified: Secondary | ICD-10-CM

## 2019-05-10 DIAGNOSIS — R0609 Other forms of dyspnea: Secondary | ICD-10-CM

## 2019-05-10 LAB — PULMONARY FUNCTION TEST
DL/VA % pred: 150 %
DL/VA: 6.35 ml/min/mmHg/L
DLCO unc % pred: 113 %
DLCO unc: 20.85 ml/min/mmHg
FEF 25-75 Post: 2 L/sec
FEF 25-75 Pre: 1.46 L/sec
FEF2575-%Change-Post: 37 %
FEF2575-%Pred-Post: 106 %
FEF2575-%Pred-Pre: 77 %
FEV1-%Change-Post: 7 %
FEV1-%Pred-Post: 70 %
FEV1-%Pred-Pre: 65 %
FEV1-Post: 1.5 L
FEV1-Pre: 1.4 L
FEV1FVC-%Change-Post: 0 %
FEV1FVC-%Pred-Pre: 108 %
FEV6-%Change-Post: 6 %
FEV6-%Pred-Post: 66 %
FEV6-%Pred-Pre: 62 %
FEV6-Post: 1.81 L
FEV6-Pre: 1.69 L
FEV6FVC-%Pred-Post: 104 %
FEV6FVC-%Pred-Pre: 104 %
FVC-%Change-Post: 6 %
FVC-%Pred-Post: 64 %
FVC-%Pred-Pre: 59 %
FVC-Post: 1.81 L
FVC-Pre: 1.69 L
Post FEV1/FVC ratio: 83 %
Post FEV6/FVC ratio: 100 %
Pre FEV1/FVC ratio: 83 %
Pre FEV6/FVC Ratio: 100 %
RV % pred: 106 %
RV: 2.18 L
TLC % pred: 84 %
TLC: 4 L

## 2019-05-10 LAB — NOVEL CORONAVIRUS, NAA (HOSP ORDER, SEND-OUT TO REF LAB; TAT 18-24 HRS): SARS-CoV-2, NAA: NOT DETECTED

## 2019-05-10 MED ORDER — IRBESARTAN 150 MG PO TABS: 150.0000 mg | ORAL_TABLET | Freq: Every day | ORAL | 5 refills | Status: DC

## 2019-05-10 MED ORDER — BUDESONIDE-FORMOTEROL FUMARATE 80-4.5 MCG/ACT IN AERO: 2.0000 | INHALATION_SPRAY | Freq: Two times a day (BID) | RESPIRATORY_TRACT | 0 refills | Status: DC

## 2019-05-10 MED ORDER — AEROCHAMBER MV MISC: 0 refills | Status: AC

## 2019-05-10 NOTE — Patient Instructions (Addendum)
We will try starting Symbicort 80/4.21mcg, 2 puffs twice daily.  You will use it through a spacer. Please rinse and gargle after you use this medication.  Keep track of whether the Symbicort helps you and your shortness of breath. Keep albuterol available.  You can use 2 puffs through spacer  if you need it for shortness of breath, chest tightness, wheezing.  Keep track of how often you use it. We will try changing your ramipril to irbesartan 150mg  daily Continue pantoprazole as you have been taking it.  Start using Zyrtec 10mg  every day until next visit.  Follow with Dr Lamonte Sakai in 6 months or sooner if you have any problems

## 2019-05-10 NOTE — Progress Notes (Signed)
Subjective:    Patient ID: Debbie Walters, female    DOB: 07/31/1950, 68 y.o.   MRN: [id number]  HPI 68 year old never smoker with a history of atrial fibrillation, DVT, hypertension.  She has a hx of childhood asthma that improved / resolved into her teens. She is referred today for further evaluation of exertional dyspnea.  She has been followed by Drs. Little and Herbie BaltimoreHarding in cardiology with reassuring evaluation.  She was also seen here by Dr. Sherene SiresWert, prompted CPEX as below.   She describes dyspnea with exertion, happens with shopping, carrying groceries. She has to stop. Hears noise, ? Stridor - others can hear it too. She has had to curtail most activities.  She can have this happen w some exposures - works as a Interior and spatial designerhairdresser. She is on ramipril. She has lost some wt - 10 lbs over 1 months. She gets daily mid sternal CP to her back, not currently on PPI. Some cough when she has these episodes.   Cardiopulmonary exercise test on 03/25/2016 reviewed by me, shows pretest spirometry with mixed obstruction and restriction.  No evidence for bronchospasm post exercise.  She did reach goal exercise with normal functional capacity, had some associated hypertension.  Appeared to have combined ventilatory limitation and circulatory limitation.  TSH and CBC have been fine per pt's report - I don't have these available right now.   ROV 05/10/2019 --this is a follow-up evaluation for exertional shortness of breath.  Patient is [age], has some symptoms consistent with possible upper airway obstruction, lower airways obstruction.  Also some degree of restriction likely due to her weight.  She has a history of hypertension and possible diastolic dysfunction.  She is on ramipril which I did not stop at our initial visit because she has had difficulty finding an adequate blood pressure regimen. ` Last time we started pantoprazole empirically, no real change in her upper airway noise.  She tells me that she  occasionally has episodes of tongue swelling throat swelling skin inflammation, question in response to a food exposure, unclear which 1.  Given this am concerned about the potential for angioedema  A chest x-ray done 03/29/2019 was reassuring without any infiltrates or abnormalities.  She underwent pulmonary function testing today which I have reviewed.  The spirometry is consistent with mixed obstruction and restriction.  She does not have a significant bronchodilator response but does have a 37% improvement in her FEF 25-75%.  Her lung volumes are normal, question pseudonormalization.  Diffusion capacity is normal.    Review of Systems  Constitutional: Negative for fever and unexpected weight change.  HENT: Negative for congestion, dental problem, ear pain, nosebleeds, postnasal drip, rhinorrhea, sinus pressure, sneezing, sore throat and trouble swallowing.   Eyes: Negative for redness and itching.  Respiratory: Positive for cough, chest tightness, shortness of breath and wheezing.   Cardiovascular: Negative for palpitations and leg swelling.  Gastrointestinal: Negative for nausea and vomiting.  Genitourinary: Negative for dysuria.  Musculoskeletal: Negative for joint swelling.  Skin: Negative for rash.  Neurological: Negative for headaches.  Hematological: Does not bruise/bleed easily.  Psychiatric/Behavioral: Negative for dysphoric mood. The patient is not nervous/anxious.      Past Medical History:  Diagnosis Date  . Atrial fibrillation (HCC)   . Colitis   . DVT (deep venous thrombosis) (HCC)   . Hypertension   . Hypothyroidism   . Morton neuroma   . Shortness of breath      Family History  Problem Relation Age  of Onset  . Cancer Mother        pancreatic  . Diabetes Mother   . Heart disease Mother   . Alcohol abuse Father   . Heart attack Sister   . Heart attack Brother      Social History   Socioeconomic History  . Marital status: Divorced    Spouse name: Not on  file  . Number of children: Not on file  . Years of education: Not on file  . Highest education level: Not on file  Occupational History  . Not on file  Social Needs  . Financial resource strain: Not on file  . Food insecurity    Worry: Not on file    Inability: Not on file  . Transportation needs    Medical: Not on file    Non-medical: Not on file  Tobacco Use  . Smoking status: Never Smoker  . Smokeless tobacco: Never Used  Substance and Sexual Activity  . Alcohol use: Yes    Comment: occ  . Drug use: No  . Sexual activity: Not on file  Lifestyle  . Physical activity    Days per week: Not on file    Minutes per session: Not on file  . Stress: Not on file  Relationships  . Social Musician on phone: Not on file    Gets together: Not on file    Attends religious service: Not on file    Active member of club or organization: Not on file    Attends meetings of clubs or organizations: Not on file    Relationship status: Not on file  . Intimate partner violence    Fear of current or ex partner: Not on file    Emotionally abused: Not on file    Physically abused: Not on file    Forced sexual activity: Not on file  Other Topics Concern  . Not on file  Social History Narrative  . Not on file     Allergies  Allergen Reactions  . Latex Rash  . Losartan Swelling and Other (See Comments)    Mimic heart attack  . Metoprolol Other (See Comments)    bradycardia bradycardia  . Norvasc [Amlodipine]     Shut kidneys down  . Vasotec [Enalapril]     Had heart issues     Outpatient Medications Prior to Visit  Medication Sig Dispense Refill  . albuterol (VENTOLIN HFA) 108 (90 Base) MCG/ACT inhaler Inhale 2 puffs into the lungs every 6 (six) hours as needed for wheezing or shortness of breath.    . furosemide (LASIX) 20 MG tablet Take 20 mg by mouth daily.    Marland Kitchen levothyroxine (SYNTHROID) 125 MCG tablet Take 125 mcg by mouth daily before breakfast. Takes 2 days a  week    . levothyroxine (SYNTHROID, LEVOTHROID) 100 MCG tablet Take 100 mcg by mouth daily before breakfast. Takes 5 days a week    . pantoprazole (PROTONIX) 40 MG tablet Take 1 tablet (40 mg total) by mouth daily. 30 tablet 5  . ramipril (ALTACE) 10 MG capsule Take 10 mg by mouth daily.     No facility-administered medications prior to visit.         Objective:   Physical Exam Vitals:   05/10/19 1013  BP: 134/72  Pulse: (!) 58  Temp: (!) 97 F (36.1 C)  TempSrc: Oral  SpO2: 99%  Weight: 167 lb (75.8 kg)  Height: 5\' 2"  (1.575 m)  Gen: Pleasant, overwt woman, in no distress,  normal affect  ENT: No lesions,  mouth clear,  oropharynx clear, no postnasal drip  Neck: No JVD, no stridor  Lungs: No use of accessory muscles, no crackles or wheezing on normal respiration, no wheeze on forced expiration  Cardiovascular: RRR, heart sounds normal, no murmur or gallops, no peripheral edema  Musculoskeletal: No deformities, no cyanosis or clubbing  Neuro: alert, awake, non focal  Skin: Warm, no lesions or rash     Assessment & Plan:  Dyspnea on exertion Suspect multifactorial.  Her PFTs show restriction, probable coexisting obstruction with mid flow reversibility, question mild asthma.  She also has upper airway obstruction which is a contributor.  I was considering changing her ramipril to an alternative and now I believe we should certainly do this since she is describing symptoms that could be consistent with episodic angioedema.  Based on the PFT we will do a trial of Symbicort.  We will give her a spacer to use for this and her albuterol.  Continue her pantoprazole and add daily Zyrtec.  She'd benefit from getting back to her exercise routine, possibly even pulmonary rehab at some point in the future.  We will try starting Symbicort 80/4.56mcg, 2 puffs twice daily.  You will use it through a spacer. Please rinse and gargle after you use this medication.  Keep track of whether the  Symbicort helps you and your shortness of breath. Keep albuterol available.  You can use 2 puffs through spacer  if you need it for shortness of breath, chest tightness, wheezing.  Keep track of how often you use it. We will try changing your ramipril to irbesartan 150mg  daily Continue pantoprazole as you have been taking it.  Start using Zyrtec 10mg  every day until next visit.  Follow with Dr Lamonte Sakai in 6 months or sooner if you have any problems  Baltazar Apo, MD, PhD 05/10/2019, 10:31 AM [facility] [phone] or if no answer [phone]

## 2019-05-10 NOTE — Progress Notes (Signed)
Full PFT performed today. °

## 2019-05-10 NOTE — Addendum Note (Signed)
Addended by: Len Blalock on: 05/10/2019 10:39 AM   Modules accepted: Orders

## 2019-05-10 NOTE — Assessment & Plan Note (Signed)
Suspect multifactorial.  Her PFTs show restriction, probable coexisting obstruction with mid flow reversibility, question mild asthma.  She also has upper airway obstruction which is a contributor.  I was considering changing her ramipril to an alternative and now I believe we should certainly do this since she is describing symptoms that could be consistent with episodic angioedema.  Based on the PFT we will do a trial of Symbicort.  We will give her a spacer to use for this and her albuterol.  Continue her pantoprazole and add daily Zyrtec.  She'd benefit from getting back to her exercise routine, possibly even pulmonary rehab at some point in the future.  We will try starting Symbicort 80/4.36mcg, 2 puffs twice daily.  You will use it through a spacer. Please rinse and gargle after you use this medication.  Keep track of whether the Symbicort helps you and your shortness of breath. Keep albuterol available.  You can use 2 puffs through spacer  if you need it for shortness of breath, chest tightness, wheezing.  Keep track of how often you use it. We will try changing your ramipril to irbesartan 150mg  daily Continue pantoprazole as you have been taking it.  Start using Zyrtec 10mg  every day until next visit.  Follow with Dr Lamonte Sakai in 6 months or sooner if you have any problems

## 2019-05-14 ENCOUNTER — Other Ambulatory Visit (HOSPITAL_COMMUNITY): Payer: Medicare Other

## 2019-06-14 DIAGNOSIS — Z0000 Encounter for general adult medical examination without abnormal findings: Secondary | ICD-10-CM | POA: Diagnosis not present

## 2019-06-14 DIAGNOSIS — Z1389 Encounter for screening for other disorder: Secondary | ICD-10-CM | POA: Diagnosis not present

## 2019-06-14 DIAGNOSIS — E039 Hypothyroidism, unspecified: Secondary | ICD-10-CM | POA: Diagnosis not present

## 2019-06-14 DIAGNOSIS — R609 Edema, unspecified: Secondary | ICD-10-CM | POA: Diagnosis not present

## 2019-06-14 DIAGNOSIS — M85859 Other specified disorders of bone density and structure, unspecified thigh: Secondary | ICD-10-CM | POA: Diagnosis not present

## 2019-06-14 DIAGNOSIS — E7800 Pure hypercholesterolemia, unspecified: Secondary | ICD-10-CM | POA: Diagnosis not present

## 2019-06-14 DIAGNOSIS — I10 Essential (primary) hypertension: Secondary | ICD-10-CM | POA: Diagnosis not present

## 2019-06-14 DIAGNOSIS — R7303 Prediabetes: Secondary | ICD-10-CM | POA: Diagnosis not present

## 2019-06-14 DIAGNOSIS — Z23 Encounter for immunization: Secondary | ICD-10-CM | POA: Diagnosis not present

## 2019-06-23 DIAGNOSIS — B349 Viral infection, unspecified: Secondary | ICD-10-CM | POA: Diagnosis not present

## 2019-07-03 DIAGNOSIS — Z20828 Contact with and (suspected) exposure to other viral communicable diseases: Secondary | ICD-10-CM | POA: Diagnosis not present

## 2019-08-24 IMAGING — MG DIGITAL DIAGNOSTIC UNILATERAL LEFT MAMMOGRAM WITH IMPLANTS, CAD
8 series · 8 of 20 positions shown · non-contrast
Comparison: 06/13/2017 and earlier

CLINICAL DATA: Patient reports LEFT breast pain. Patient fell
directly on her breast in July 2017. She reports the LEFT breast
is not normal since the fall.

EXAM:
DIGITAL DIAGNOSTIC LEFT MAMMOGRAM WITH CAD AND TOMO
ULTRASOUND LEFT BREAST

[L MLO]
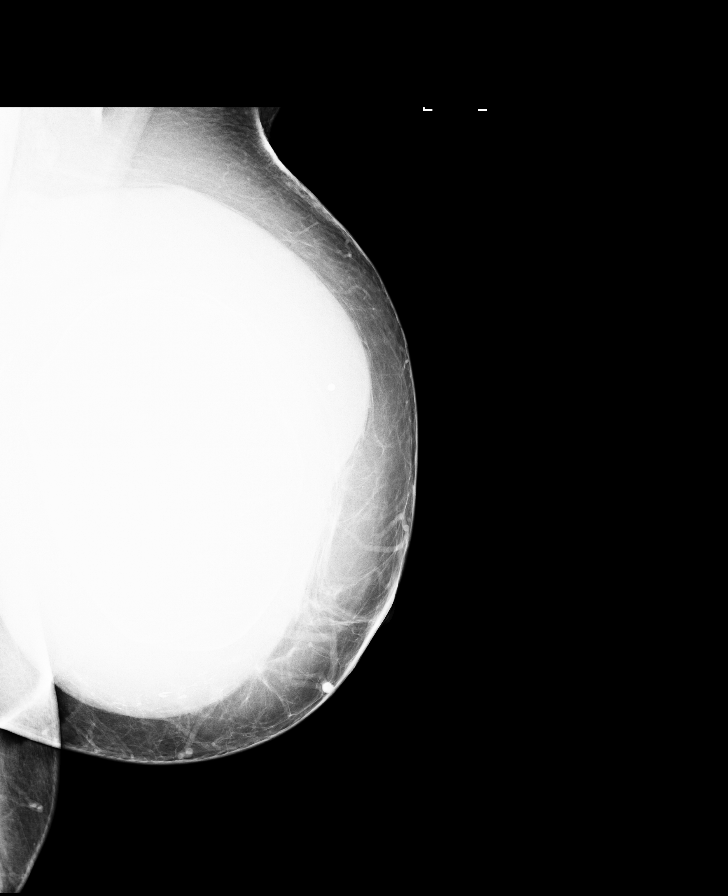

[L CC]
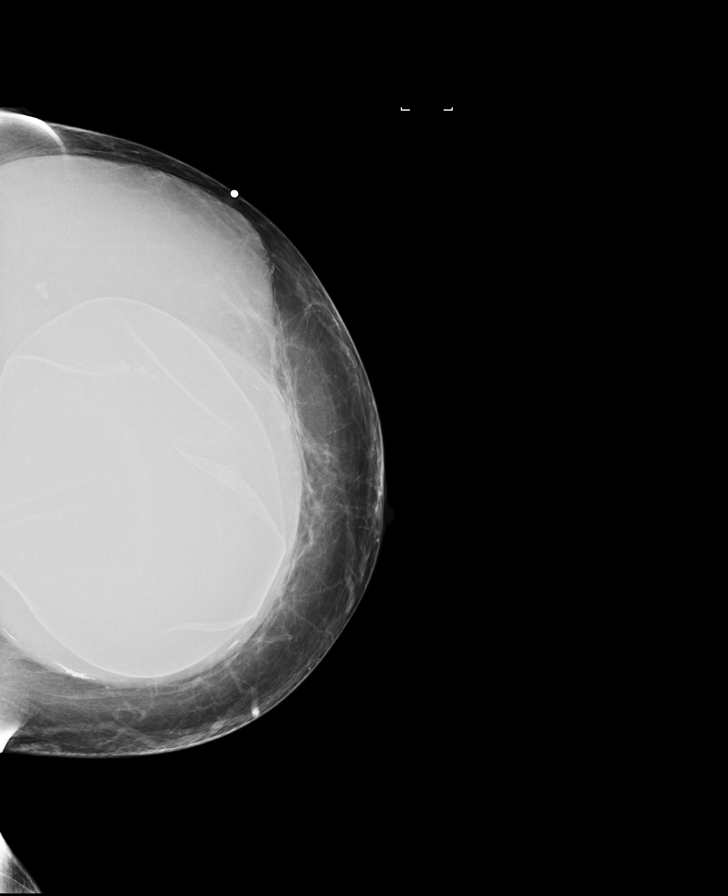

[L TAN synth-2D]
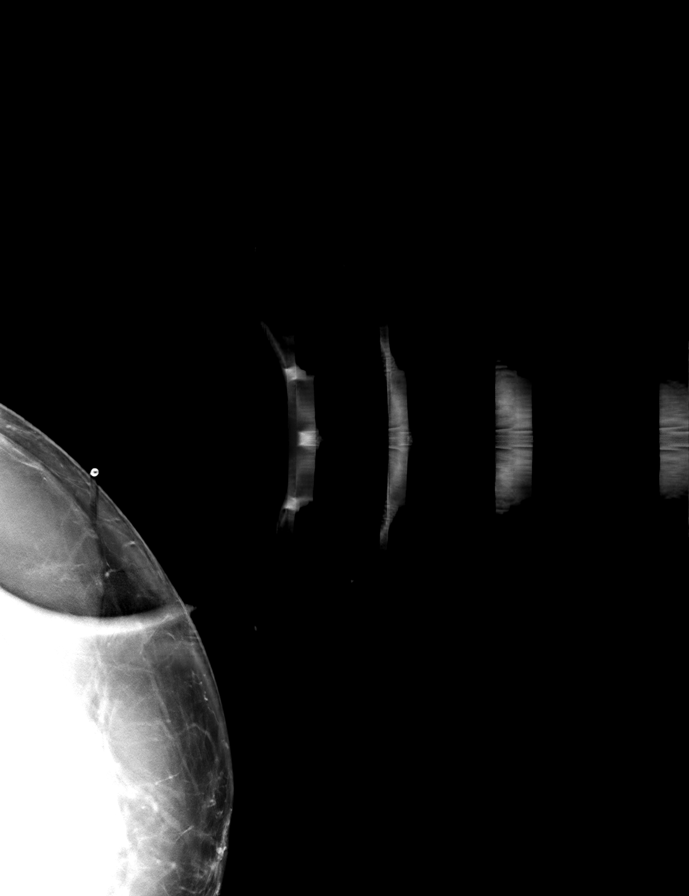

[L CC synth-2D]
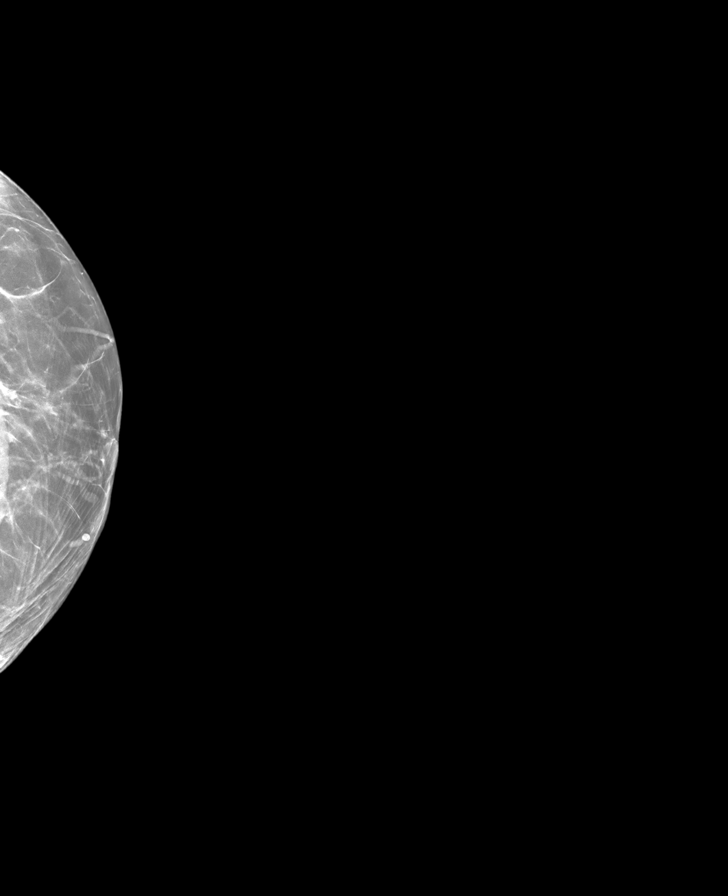

[L MLO synth-2D]
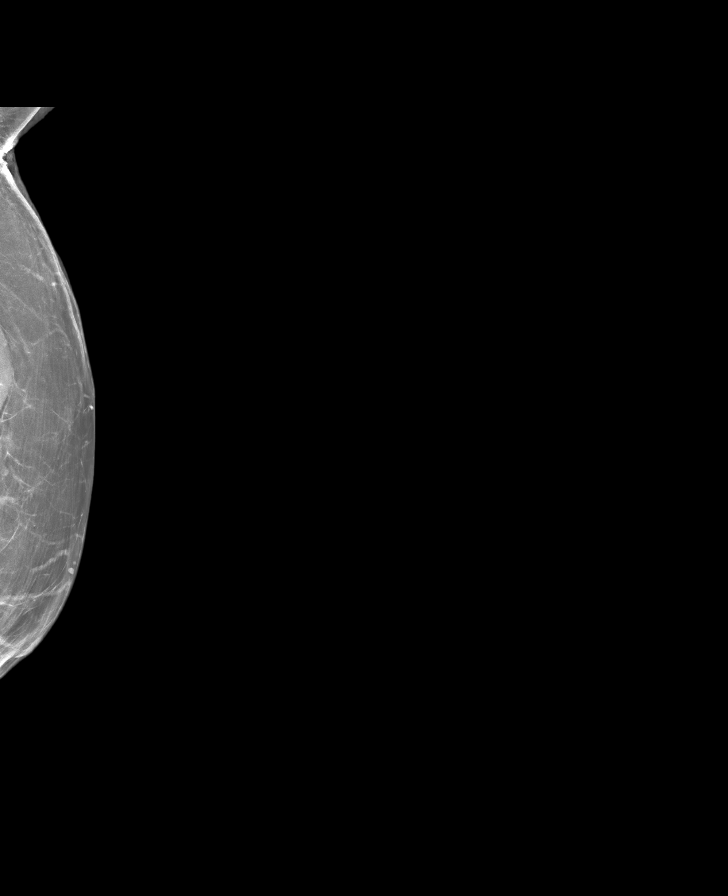

[L CCID BREAST TOMOSYNTHESIS IMAGE tomo · tomo slice 31/60.0]
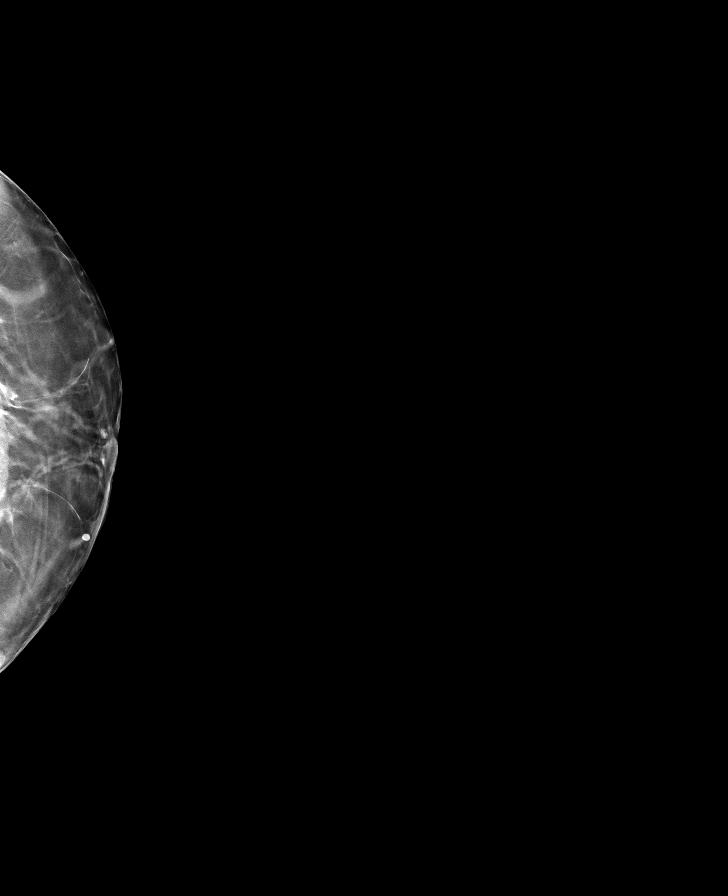

[L TAN tomo · tomo slice 37/73.0]
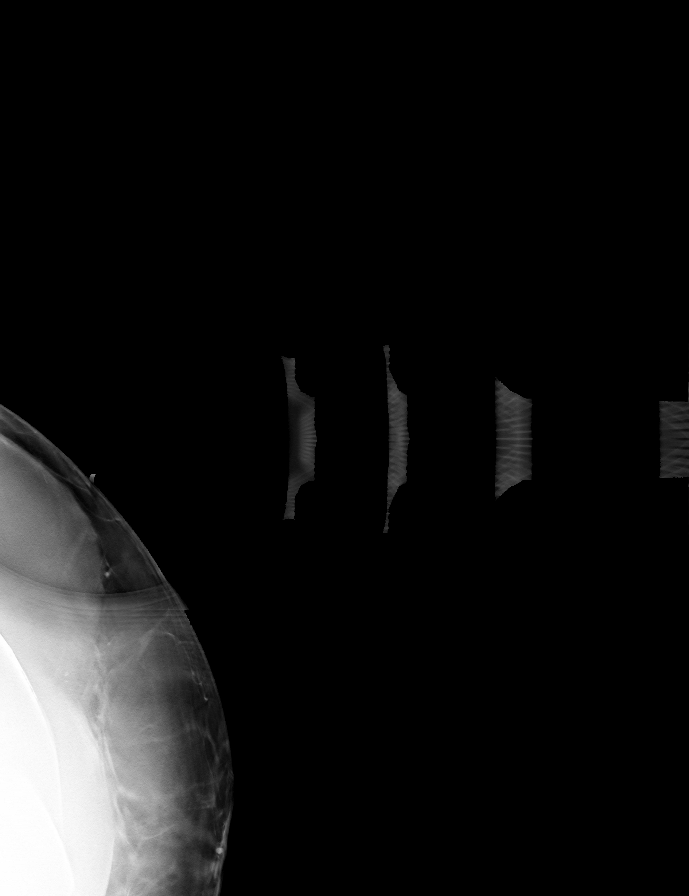

[L MLOID BREAST TOMOSYNTHESIS IMAGE tomo · tomo slice 31/62.0]
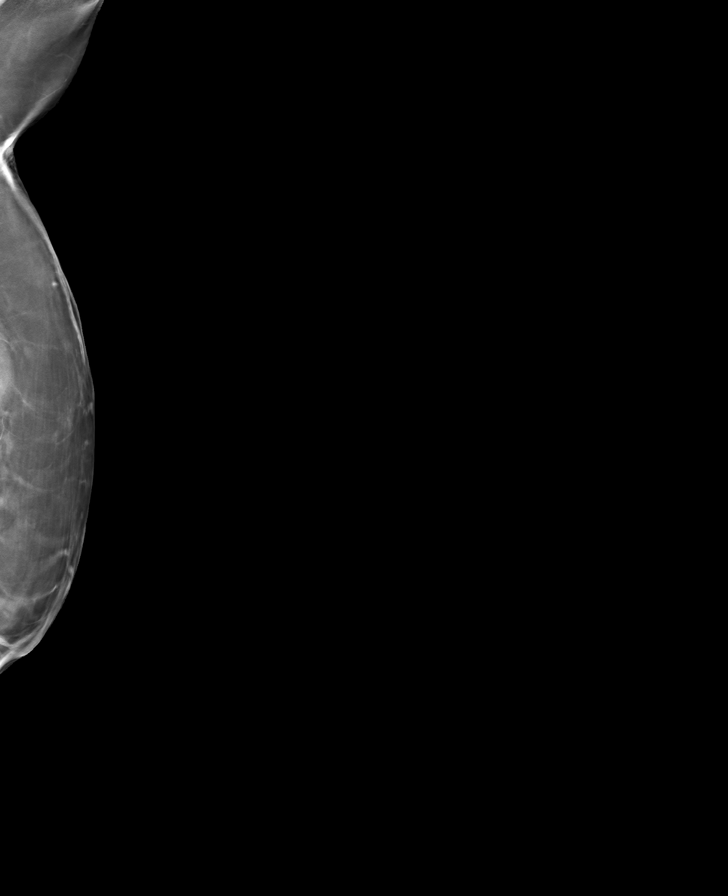

[8 of 20 positions shown; findings below may reference images not displayed]

ACR Breast Density Category b: There are scattered areas of
fibroglandular density.
FINDINGS: The patient has bilateral retroglandular saline implants. There is
an isodense mass surrounding the LEFT implant, accentuated along the
LATERAL aspect of the breast in this areas marked with a BB as the
area of palpable concern.

Mammographic images were processed with CAD.

On physical exam, there is visible deformity of the LEFT breast with
a protuberant mass along the UPPER OUTER QUADRANT in the area of
concern. There is no ecchymosis.

Targeted ultrasound is performed, showing a large thick fluid
collection surrounding the LEFT breast implant. The LATERAL aspect
of the fluid collection measures greater than 11 x 8 centimeters.
Within the LATERAL portion of the collection, there are numerous
hyperechoic mural-based masses, not associated with blood flow on
Doppler evaluation.
IMPRESSION: 1. Complex collection surrounding the LEFT saline implant. The
implant appears intact.
2. Considerations include hematoma, seroma, infection, or lymphoma.

RECOMMENDATION:
Ultrasound guided fluid aspiration and cytology recommended for
diagnosis. The patient will be scheduled for the procedure.

I have discussed the findings and recommendations with the patient.
Results were also provided in writing at the conclusion of the
visit. If applicable, a reminder letter will be sent to the patient
regarding the next appointment.

BI-RADS CATEGORY  4: Suspicious.

## 2019-08-24 IMAGING — US ULTRASOUND LEFT BREAST LIMITED
1 series · 13 of 13 positions shown · non-contrast
Comparison: 06/13/2017 and earlier

CLINICAL DATA: Patient reports LEFT breast pain. Patient fell
directly on her breast in July 2017. She reports the LEFT breast
is not normal since the fall.

EXAM:
DIGITAL DIAGNOSTIC LEFT MAMMOGRAM WITH CAD AND TOMO
ULTRASOUND LEFT BREAST

[Series 1: ultrasound left breast limited · 0.21mm/px · 13 of 13 slices shown]
[im 1/13]
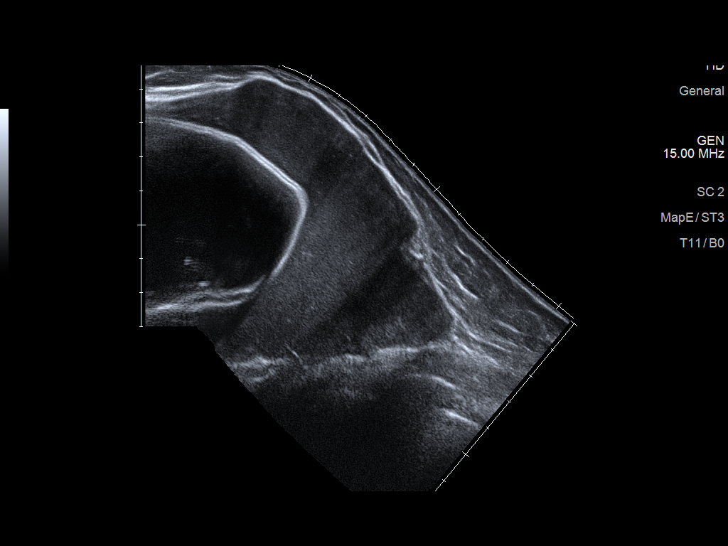
[im 2/13]
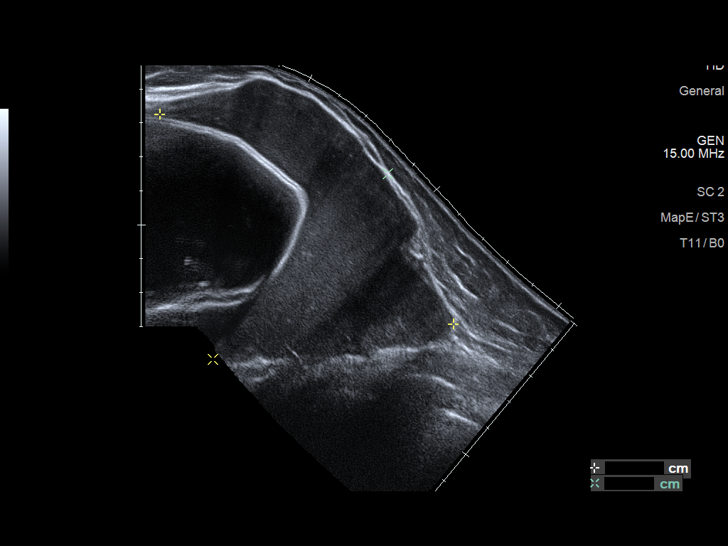
[im 3/13]
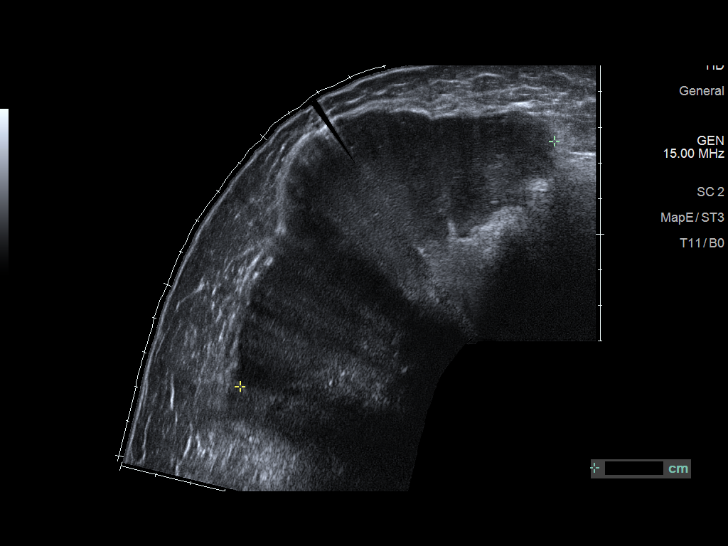
[im 4/13]
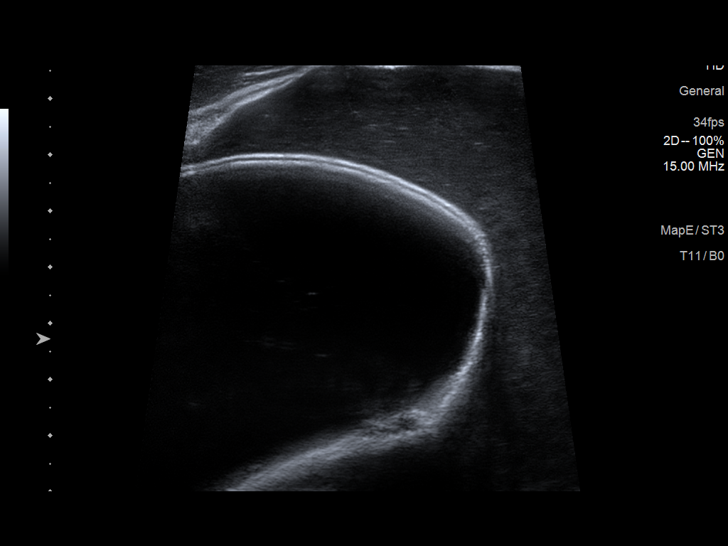
[im 5/13]
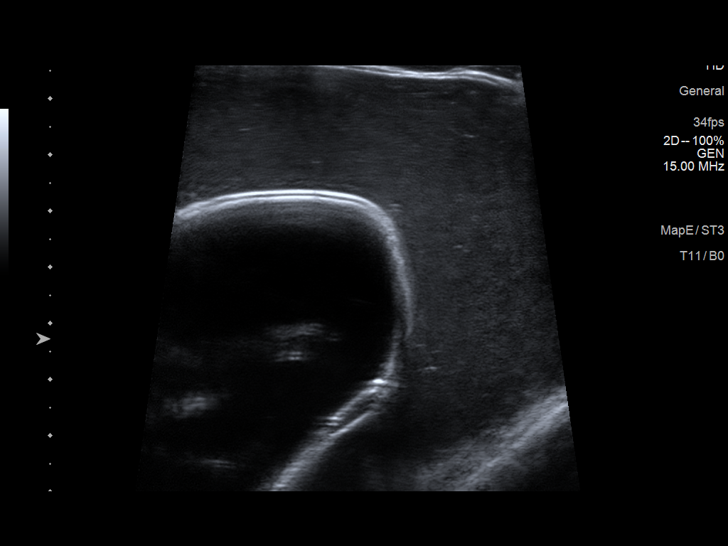
[im 6/13]
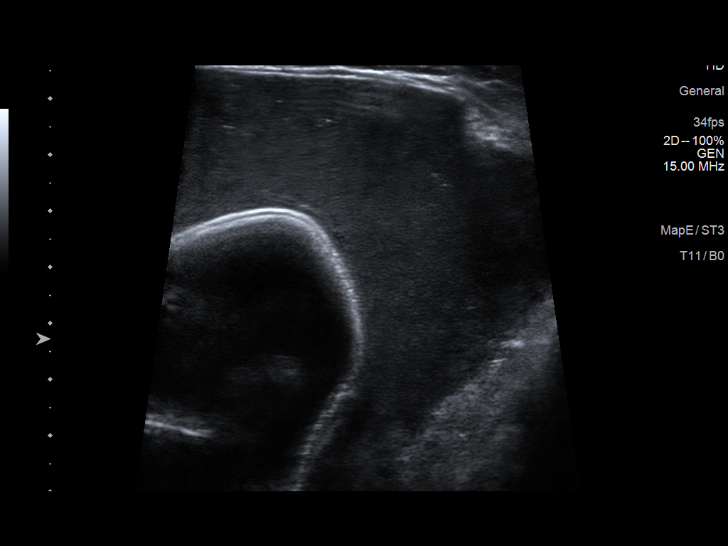
[im 7/13]
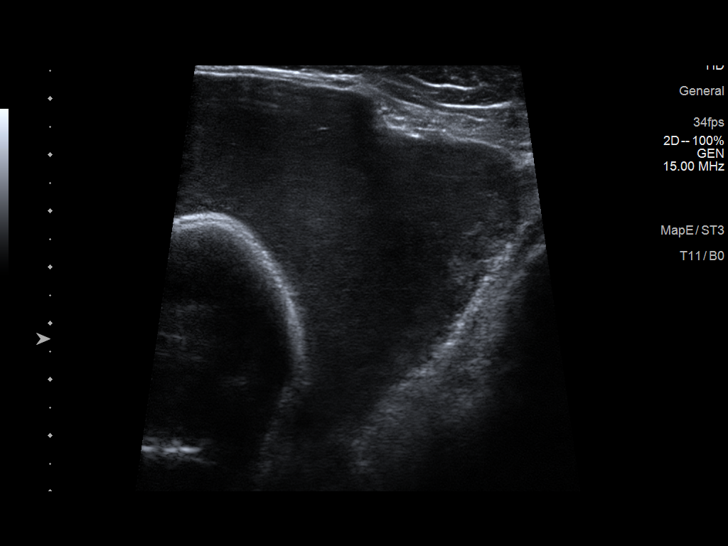
[im 8/13]
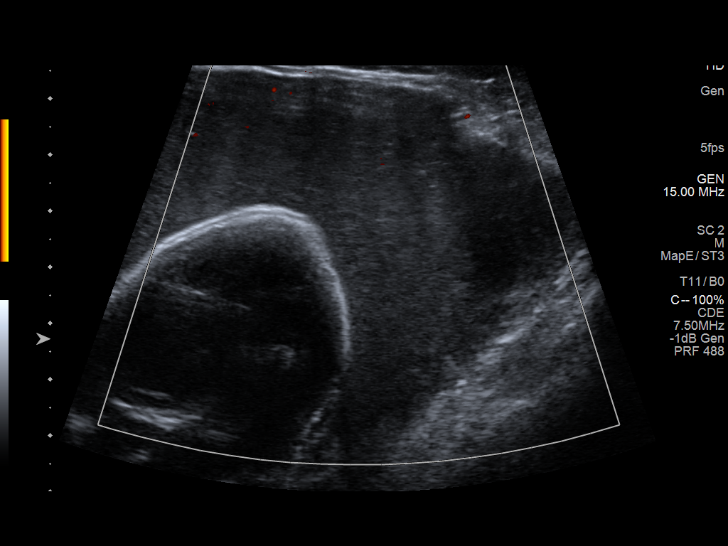
[im 9/13]
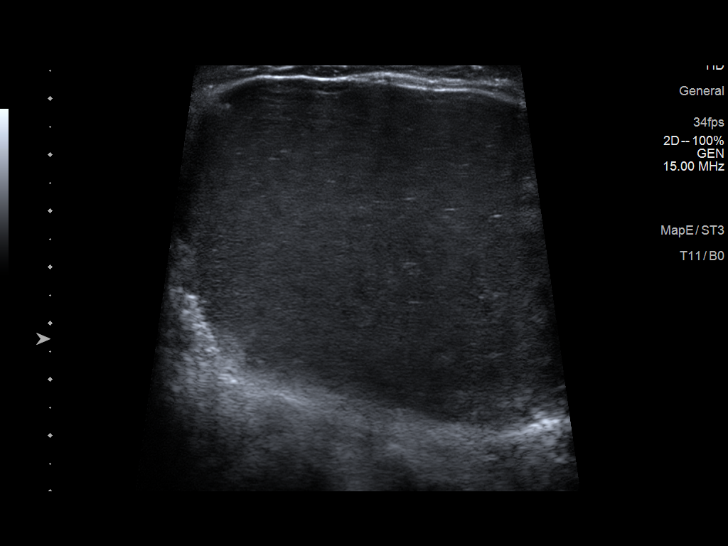
[im 10/13]
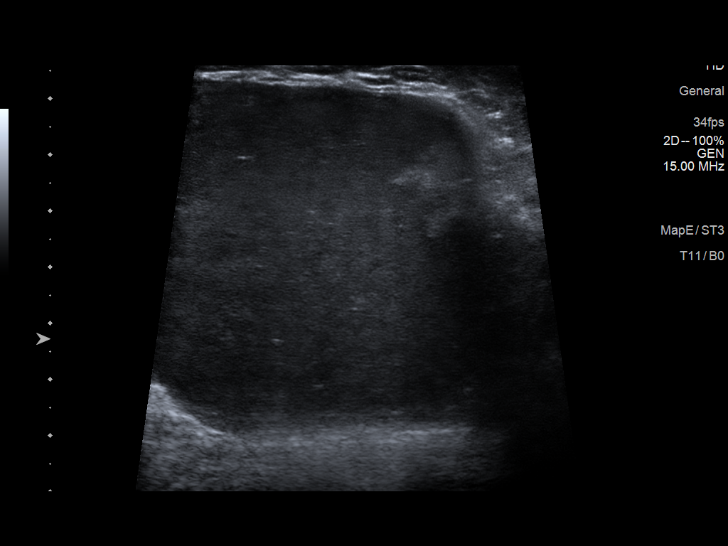
[im 11/13]
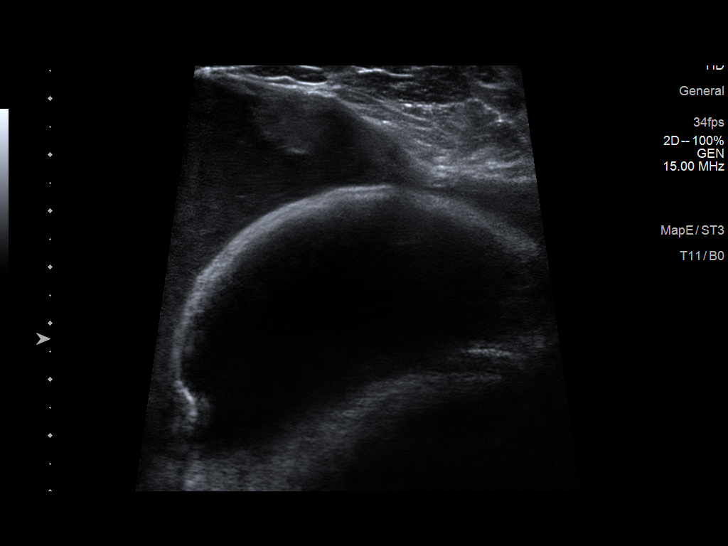
[im 12/13]
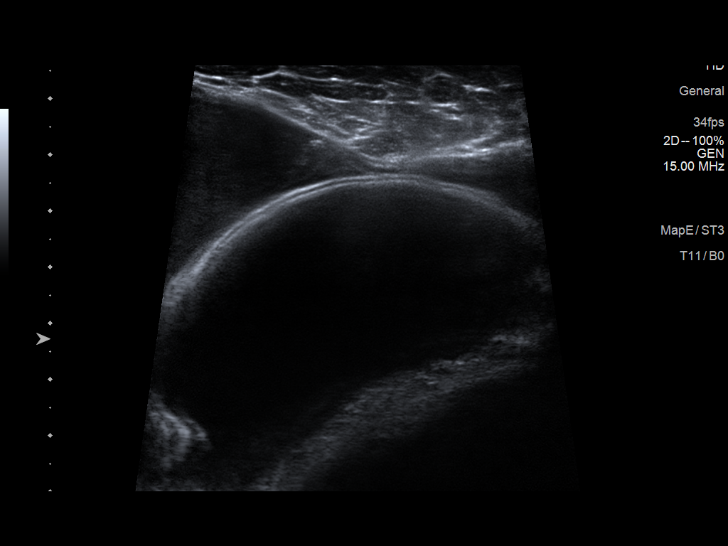
[im 13/13]
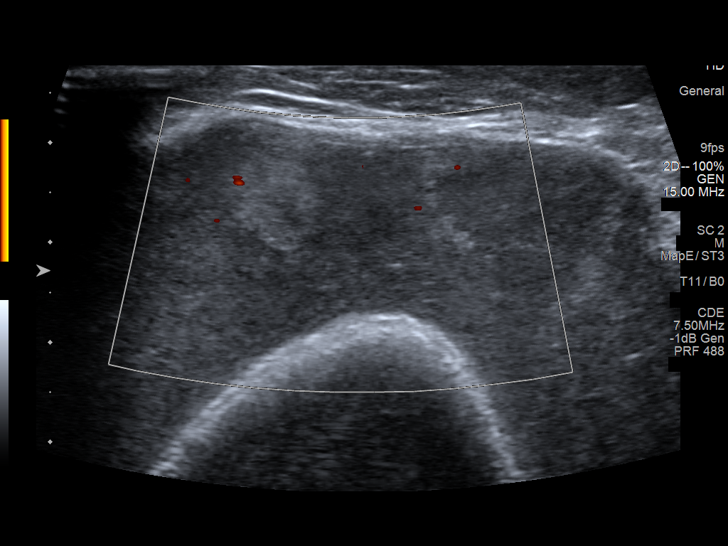

[13 of 13 positions shown; findings below may reference images not displayed]

ACR Breast Density Category b: There are scattered areas of
fibroglandular density.
FINDINGS: The patient has bilateral retroglandular saline implants. There is
an isodense mass surrounding the LEFT implant, accentuated along the
LATERAL aspect of the breast in this areas marked with a BB as the
area of palpable concern.

Mammographic images were processed with CAD.

On physical exam, there is visible deformity of the LEFT breast with
a protuberant mass along the UPPER OUTER QUADRANT in the area of
concern. There is no ecchymosis.

Targeted ultrasound is performed, showing a large thick fluid
collection surrounding the LEFT breast implant. The LATERAL aspect
of the fluid collection measures greater than 11 x 8 centimeters.
Within the LATERAL portion of the collection, there are numerous
hyperechoic mural-based masses, not associated with blood flow on
Doppler evaluation.
IMPRESSION: 1. Complex collection surrounding the LEFT saline implant. The
implant appears intact.
2. Considerations include hematoma, seroma, infection, or lymphoma.

RECOMMENDATION:
Ultrasound guided fluid aspiration and cytology recommended for
diagnosis. The patient will be scheduled for the procedure.

I have discussed the findings and recommendations with the patient.
Results were also provided in writing at the conclusion of the
visit. If applicable, a reminder letter will be sent to the patient
regarding the next appointment.

BI-RADS CATEGORY  4: Suspicious.

## 2019-09-09 DIAGNOSIS — E039 Hypothyroidism, unspecified: Secondary | ICD-10-CM | POA: Diagnosis not present

## 2019-09-09 DIAGNOSIS — I10 Essential (primary) hypertension: Secondary | ICD-10-CM | POA: Diagnosis not present

## 2019-09-09 DIAGNOSIS — E7800 Pure hypercholesterolemia, unspecified: Secondary | ICD-10-CM | POA: Diagnosis not present

## 2019-11-04 ENCOUNTER — Other Ambulatory Visit: Payer: Self-pay | Admitting: Emergency Medicine

## 2019-12-01 DIAGNOSIS — N390 Urinary tract infection, site not specified: Secondary | ICD-10-CM | POA: Diagnosis not present

## 2020-01-27 ENCOUNTER — Other Ambulatory Visit: Payer: Self-pay | Admitting: Emergency Medicine

## 2020-03-06 DIAGNOSIS — M25561 Pain in right knee: Secondary | ICD-10-CM | POA: Diagnosis not present

## 2020-03-06 DIAGNOSIS — M25562 Pain in left knee: Secondary | ICD-10-CM | POA: Diagnosis not present

## 2020-03-06 DIAGNOSIS — M1712 Unilateral primary osteoarthritis, left knee: Secondary | ICD-10-CM | POA: Diagnosis not present

## 2020-03-06 DIAGNOSIS — M1711 Unilateral primary osteoarthritis, right knee: Secondary | ICD-10-CM | POA: Diagnosis not present

## 2020-03-20 DIAGNOSIS — Z23 Encounter for immunization: Secondary | ICD-10-CM | POA: Diagnosis not present

## 2020-04-10 DIAGNOSIS — Z23 Encounter for immunization: Secondary | ICD-10-CM | POA: Diagnosis not present

## 2020-05-05 ENCOUNTER — Telehealth: Payer: Self-pay | Admitting: Emergency Medicine

## 2020-05-05 ENCOUNTER — Other Ambulatory Visit: Payer: Self-pay

## 2020-05-05 MED ORDER — BUDESONIDE-FORMOTEROL FUMARATE 80-4.5 MCG/ACT IN AERO: 2.0000 | INHALATION_SPRAY | Freq: Two times a day (BID) | RESPIRATORY_TRACT | 0 refills | Status: AC

## 2020-05-05 NOTE — Telephone Encounter (Signed)
Spoke with pt who is requesting Symbicort refill. Pt was in need of an OV so she scheduled appointment for an OV on 05/08/20 with Dr. Delton Coombes. I refill was sent into [facility] on [address]. Nothing further needed at this time.

## 2020-05-08 ENCOUNTER — Other Ambulatory Visit: Payer: Self-pay

## 2020-05-08 ENCOUNTER — Encounter: Payer: Self-pay | Admitting: Emergency Medicine

## 2020-05-08 ENCOUNTER — Ambulatory Visit (INDEPENDENT_AMBULATORY_CARE_PROVIDER_SITE_OTHER): Payer: Medicare Other | Admitting: Emergency Medicine

## 2020-05-08 DIAGNOSIS — J4520 Mild intermittent asthma, uncomplicated: Secondary | ICD-10-CM | POA: Diagnosis not present

## 2020-05-08 DIAGNOSIS — J45909 Unspecified asthma, uncomplicated: Secondary | ICD-10-CM | POA: Insufficient documentation

## 2020-05-08 NOTE — Assessment & Plan Note (Signed)
We will try to restart your Symbicort 160/4.5 mcg, 2 puffs twice a day.  Rinse and gargle after using.  We will work on getting your insurance formulary to see if there is a substitution that is more affordable.  We will also try to work on financial assistance from the pharmaceutical company. Keep your albuterol available use 2 puffs when you need if shortness breath, chest tightness, wheezing. Okay to continue ramipril for now.  We may need to retry changing this at some point going forward depending on how your asthma, cough, throat irritation progress. Follow with Dr Yosgar Demirjian in 6 months or sooner if you have any problems  

## 2020-05-08 NOTE — Progress Notes (Signed)
Subjective:    Patient ID: Debbie Walters, female    DOB: [birth year], 69 y.o.   MRN: [id number]  HPI 69 year old never smoker with a history of atrial fibrillation, DVT, hypertension.  She has a hx of childhood asthma that improved / resolved into her teens. She is referred today for further evaluation of exertional dyspnea.  She has been followed by Drs. Little and Herbie Baltimore in cardiology with reassuring evaluation.  She was also seen here by Dr. Sherene Sires, prompted CPEX as below.   She describes dyspnea with exertion, happens with shopping, carrying groceries. She has to stop. Hears noise, ? Stridor - others can hear it too. She has had to curtail most activities.  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 She has lost some wt - 10 lbs over 1 months. She gets daily mid sternal CP to her back, not currently on PPI. Some cough when she has these episodes.   Cardiopulmonary exercise test on 03/25/2016 reviewed by me, shows pretest spirometry with mixed obstruction and restriction.  No evidence for bronchospasm post exercise.  She did reach goal exercise with normal functional capacity, had some associated hypertension.  Appeared to have combined ventilatory limitation and circulatory limitation.  TSH and CBC have been fine per pt's report - I don't have these available right now.   ROV 05/10/2019 --this is a follow-up evaluation for exertional shortness of breath.  Patient is 69, has some symptoms consistent with possible upper airway obstruction, lower airways obstruction.  Also some degree of restriction likely due to her weight.  She has a history of hypertension and possible diastolic dysfunction.  She is on ramipril which I did not stop at our initial visit because she has had difficulty finding an adequate blood pressure regimen. ` Last time we started pantoprazole empirically, no real change in her upper airway noise.  She tells me that she  occasionally has episodes of tongue swelling throat swelling skin inflammation, question in response to a food exposure, unclear which 1.  Given this am concerned about the potential for angioedema  A chest x-ray done 03/29/2019 was reassuring without any infiltrates or abnormalities.  She underwent pulmonary function testing today which I have reviewed.  The spirometry is consistent with mixed obstruction and restriction.  She does not have a significant bronchodilator response but does have a 37% improvement in her FEF 25-75%.  Her lung volumes are normal, question pseudonormalization.  Diffusion capacity is normal.  ROV 05/08/20 --Debbie Walters is 69 year old never smoker, has a history of mixed obstruction and restriction.  She had some upper airway symptoms then we changed her ramipril to irbesartan a year ago - she didn't tolerate this because she started having UTI's. Now back on ramipril.  Continued pantoprazole, started Zyrtec.  She had COVID in 05/2019 - was quite ill, has had a long recovery period and feels that she is getting back to normal.  She uses Symbicort but picks her times - not every day until about the last two months. Now cost is a barrier > Wasn't able to pick it up recently due to cost. Has albuterol, uses rarely. No cough, some occasional raspiness in the am.     Review of Systems  Constitutional: Negative for fever and unexpected weight change.  HENT: Negative for congestion, dental problem, ear pain, nosebleeds, postnasal drip, rhinorrhea, sinus pressure, sneezing, sore throat and trouble swallowing.   Eyes: Negative for redness and  itching.  Respiratory: Positive for cough, chest tightness, shortness of breath and wheezing.   Cardiovascular: Negative for palpitations and leg swelling.  Gastrointestinal: Negative for nausea and vomiting.  Genitourinary: Negative for dysuria.  Musculoskeletal: Negative for joint swelling.  Skin: Negative for rash.  Neurological: Negative for  headaches.  Hematological: Does not bruise/bleed easily.  Psychiatric/Behavioral: Negative for dysphoric mood. The patient is not nervous/anxious.      Past Medical History:  Diagnosis Date  . Atrial fibrillation (HCC)   . Colitis   . DVT (deep venous thrombosis) (HCC)   . Hypertension   . Hypothyroidism   . Morton neuroma   . Shortness of breath      Family History  Problem Relation Age of Onset  . Cancer Mother        pancreatic  . Diabetes Mother   . Heart disease Mother   . Alcohol abuse Father   . Heart attack Sister   . Heart attack Brother      Social History   Socioeconomic History  . Marital status: Divorced    Spouse name: Not on file  . Number of children: Not on file  . Years of education: Not on file  . Highest education level: Not on file  Occupational History  . Not on file  Tobacco Use  . Smoking status: Never Smoker  . Smokeless tobacco: Never Used  Substance and Sexual Activity  . Alcohol use: Yes    Comment: occ  . Drug use: No  . Sexual activity: Not on file  Other Topics Concern  . Not on file  Social History Narrative  . Not on file   Social Determinants of Health   Financial Resource Strain:   . Difficulty of Paying Living Expenses: Not on file  Food Insecurity:   . Worried About Programme researcher, broadcasting/film/video in the Last Year: Not on file  . Ran Out of Food in the Last Year: Not on file  Transportation Needs:   . Lack of Transportation (Medical): Not on file  . Lack of Transportation (Non-Medical): Not on file  Physical Activity:   . Days of Exercise per Week: Not on file  . Minutes of Exercise per Session: Not on file  Stress:   . Feeling of Stress : Not on file  Social Connections:   . Frequency of Communication with Friends and Family: Not on file  . Frequency of Social Gatherings with Friends and Family: Not on file  . Attends Religious Services: Not on file  . Active Member of Clubs or Organizations: Not on file  . Attends Occupational hygienist Meetings: Not on file  . Marital Status: Not on file  Intimate Partner Violence:   . Fear of Current or Ex-Partner: Not on file  . Emotionally Abused: Not on file  . Physically Abused: Not on file  . Sexually Abused: Not on file     Allergies  Allergen Reactions  . Latex Rash  . Losartan Swelling and Other (See Comments)    Mimic heart attack  . Metoprolol Other (See Comments)    bradycardia bradycardia  . Norvasc [Amlodipine]     Shut kidneys down  . Vasotec [Enalapril]     Had heart issues     Outpatient Medications Prior to Visit  Medication Sig Dispense Refill  . albuterol (VENTOLIN HFA) 108 (90 Base) MCG/ACT inhaler Inhale 2 puffs into the lungs every 6 (six) hours as needed for wheezing or shortness of breath.    Marland Kitchen  budesonide-formoterol (SYMBICORT) 80-4.5 MCG/ACT inhaler Inhale 2 puffs into the lungs 2 (two) times daily. 10.2 g 0  . furosemide (LASIX) 20 MG tablet Take 20 mg by mouth daily.    . irbesartan (AVAPRO) 150 MG tablet TAKE 1 TABLET BY MOUTH EVERY DAY 90 tablet 0  . levothyroxine (SYNTHROID) 125 MCG tablet Take 125 mcg by mouth daily before breakfast. Takes 2 days a week    . levothyroxine (SYNTHROID, LEVOTHROID) 100 MCG tablet Take 100 mcg by mouth daily before breakfast. Takes 5 days a week    . Spacer/Aero-Holding Chambers (AEROCHAMBER MV) inhaler Use as instructed 1 each 0  . pantoprazole (PROTONIX) 40 MG tablet Take 1 tablet (40 mg total) by mouth daily. (Patient not taking: Reported on 05/08/2020) 30 tablet 5   No facility-administered medications prior to visit.        Objective:   Physical Exam Vitals:   05/08/20 0916  BP: 130/78  Pulse: (!) 58  Temp: 98.4 F (36.9 C)  TempSrc: Oral  SpO2: 98%  Weight: 175 lb 12.8 oz (79.7 kg)  Height: 5\' 2"  (1.575 m)   Gen: Pleasant, overwt woman, in no distress,  normal affect  ENT: No lesions,  mouth clear,  oropharynx clear, no postnasal drip  Neck: No JVD, no stridor  Lungs: No use  of accessory muscles, no crackles or wheezing on normal respiration, no wheeze on forced expiration  Cardiovascular: RRR, heart sounds normal, no murmur or gallops, no peripheral edema  Musculoskeletal: No deformities, no cyanosis or clubbing  Neuro: alert, awake, non focal  Skin: Warm, no lesions or rash     Assessment & Plan:  Asthma We will try to restart your Symbicort 160/4.5 mcg, 2 puffs twice a day.  Rinse and gargle after using.  We will work on formulary to see if there is a substitution that is more affordable.  We will also try to work on financial assistance from [facility]. Keep your albuterol available use 2 puffs when you need if shortness breath, chest tightness, wheezing. Okay to continue ramipril for now.  We may need to retry changing this at some point going forward depending on how your asthma, cough, throat irritation progress. Follow with Dr [employer] in 6 months or sooner if you have any problems  Delton Coombes, MD, PhD 05/08/2020, 9:39 AM [facility] [phone] or if no answer [phone]

## 2020-05-08 NOTE — Patient Instructions (Signed)
We will try to restart your Symbicort 160/4.5 mcg, 2 puffs twice a day.  Rinse and gargle after using.  We will work on [employer] [employer] formulary to see if there is a substitution that is more affordable.  We will also try to work on financial assistance from [employer]. Keep your albuterol available use 2 puffs when you need if shortness breath, chest tightness, wheezing. Okay to continue ramipril for now.  We may need to retry changing this at some point going forward depending on how your asthma, cough, throat irritation progress. Follow with Dr Delton Coombes in 6 months or sooner if you have any problems

## 2020-05-16 ENCOUNTER — Telehealth: Payer: Self-pay | Admitting: Emergency Medicine

## 2020-05-16 NOTE — Telephone Encounter (Signed)
Spoke with pt and informed her fax was faxed today. Pt stated understanding. Nothing further needed at this time.

## 2020-05-16 NOTE — Telephone Encounter (Signed)
Application for pt assistance was faxed to [employer] along with supporting documention provided by pt. Original application and supporting documention was placed in Dr. Kavin Leech box for storage. Nothing further needed at this time.

## 2020-05-19 ENCOUNTER — Telehealth: Payer: Self-pay | Admitting: Emergency Medicine

## 2020-05-19 NOTE — Telephone Encounter (Signed)
Spoke with pt who spoke with [employer] and was informed that [employer] had all the information that was needed. Pt stated understanding and that she would let us know if she needed anything else. Nothing further needed at this time.

## 2020-06-19 DIAGNOSIS — E7800 Pure hypercholesterolemia, unspecified: Secondary | ICD-10-CM | POA: Diagnosis not present

## 2020-06-19 DIAGNOSIS — Z1389 Encounter for screening for other disorder: Secondary | ICD-10-CM | POA: Diagnosis not present

## 2020-06-19 DIAGNOSIS — J4530 Mild persistent asthma, uncomplicated: Secondary | ICD-10-CM | POA: Diagnosis not present

## 2020-06-19 DIAGNOSIS — Z0000 Encounter for general adult medical examination without abnormal findings: Secondary | ICD-10-CM | POA: Diagnosis not present

## 2020-06-19 DIAGNOSIS — M85859 Other specified disorders of bone density and structure, unspecified thigh: Secondary | ICD-10-CM | POA: Diagnosis not present

## 2020-06-19 DIAGNOSIS — Z1159 Encounter for screening for other viral diseases: Secondary | ICD-10-CM | POA: Diagnosis not present

## 2020-06-19 DIAGNOSIS — I10 Essential (primary) hypertension: Secondary | ICD-10-CM | POA: Diagnosis not present

## 2020-06-19 DIAGNOSIS — K219 Gastro-esophageal reflux disease without esophagitis: Secondary | ICD-10-CM | POA: Diagnosis not present

## 2020-06-19 DIAGNOSIS — E039 Hypothyroidism, unspecified: Secondary | ICD-10-CM | POA: Diagnosis not present

## 2020-06-19 DIAGNOSIS — R609 Edema, unspecified: Secondary | ICD-10-CM | POA: Diagnosis not present

## 2020-10-23 DIAGNOSIS — M25561 Pain in right knee: Secondary | ICD-10-CM | POA: Diagnosis not present

## 2020-10-23 DIAGNOSIS — M1712 Unilateral primary osteoarthritis, left knee: Secondary | ICD-10-CM | POA: Diagnosis not present

## 2020-10-23 DIAGNOSIS — M25562 Pain in left knee: Secondary | ICD-10-CM | POA: Diagnosis not present

## 2020-10-23 DIAGNOSIS — M1711 Unilateral primary osteoarthritis, right knee: Secondary | ICD-10-CM | POA: Diagnosis not present

## 2020-10-23 DIAGNOSIS — M170 Bilateral primary osteoarthritis of knee: Secondary | ICD-10-CM | POA: Diagnosis not present

## 2020-11-13 DIAGNOSIS — L570 Actinic keratosis: Secondary | ICD-10-CM | POA: Diagnosis not present

## 2021-01-08 DIAGNOSIS — R252 Cramp and spasm: Secondary | ICD-10-CM | POA: Diagnosis not present

## 2021-01-08 DIAGNOSIS — R0602 Shortness of breath: Secondary | ICD-10-CM | POA: Diagnosis not present

## 2021-01-08 DIAGNOSIS — R7303 Prediabetes: Secondary | ICD-10-CM | POA: Diagnosis not present

## 2021-01-08 DIAGNOSIS — E039 Hypothyroidism, unspecified: Secondary | ICD-10-CM | POA: Diagnosis not present

## 2021-01-08 DIAGNOSIS — I10 Essential (primary) hypertension: Secondary | ICD-10-CM | POA: Diagnosis not present

## 2021-01-08 DIAGNOSIS — E7800 Pure hypercholesterolemia, unspecified: Secondary | ICD-10-CM | POA: Diagnosis not present

## 2021-01-16 IMAGING — DX DG CHEST 2V
2 series · 2 of 2 positions shown · non-contrast
Comparison: March 11, 2016

CLINICAL DATA: Dyspnea on exertion

EXAM:
CHEST - 2 VIEW

[chest pa]
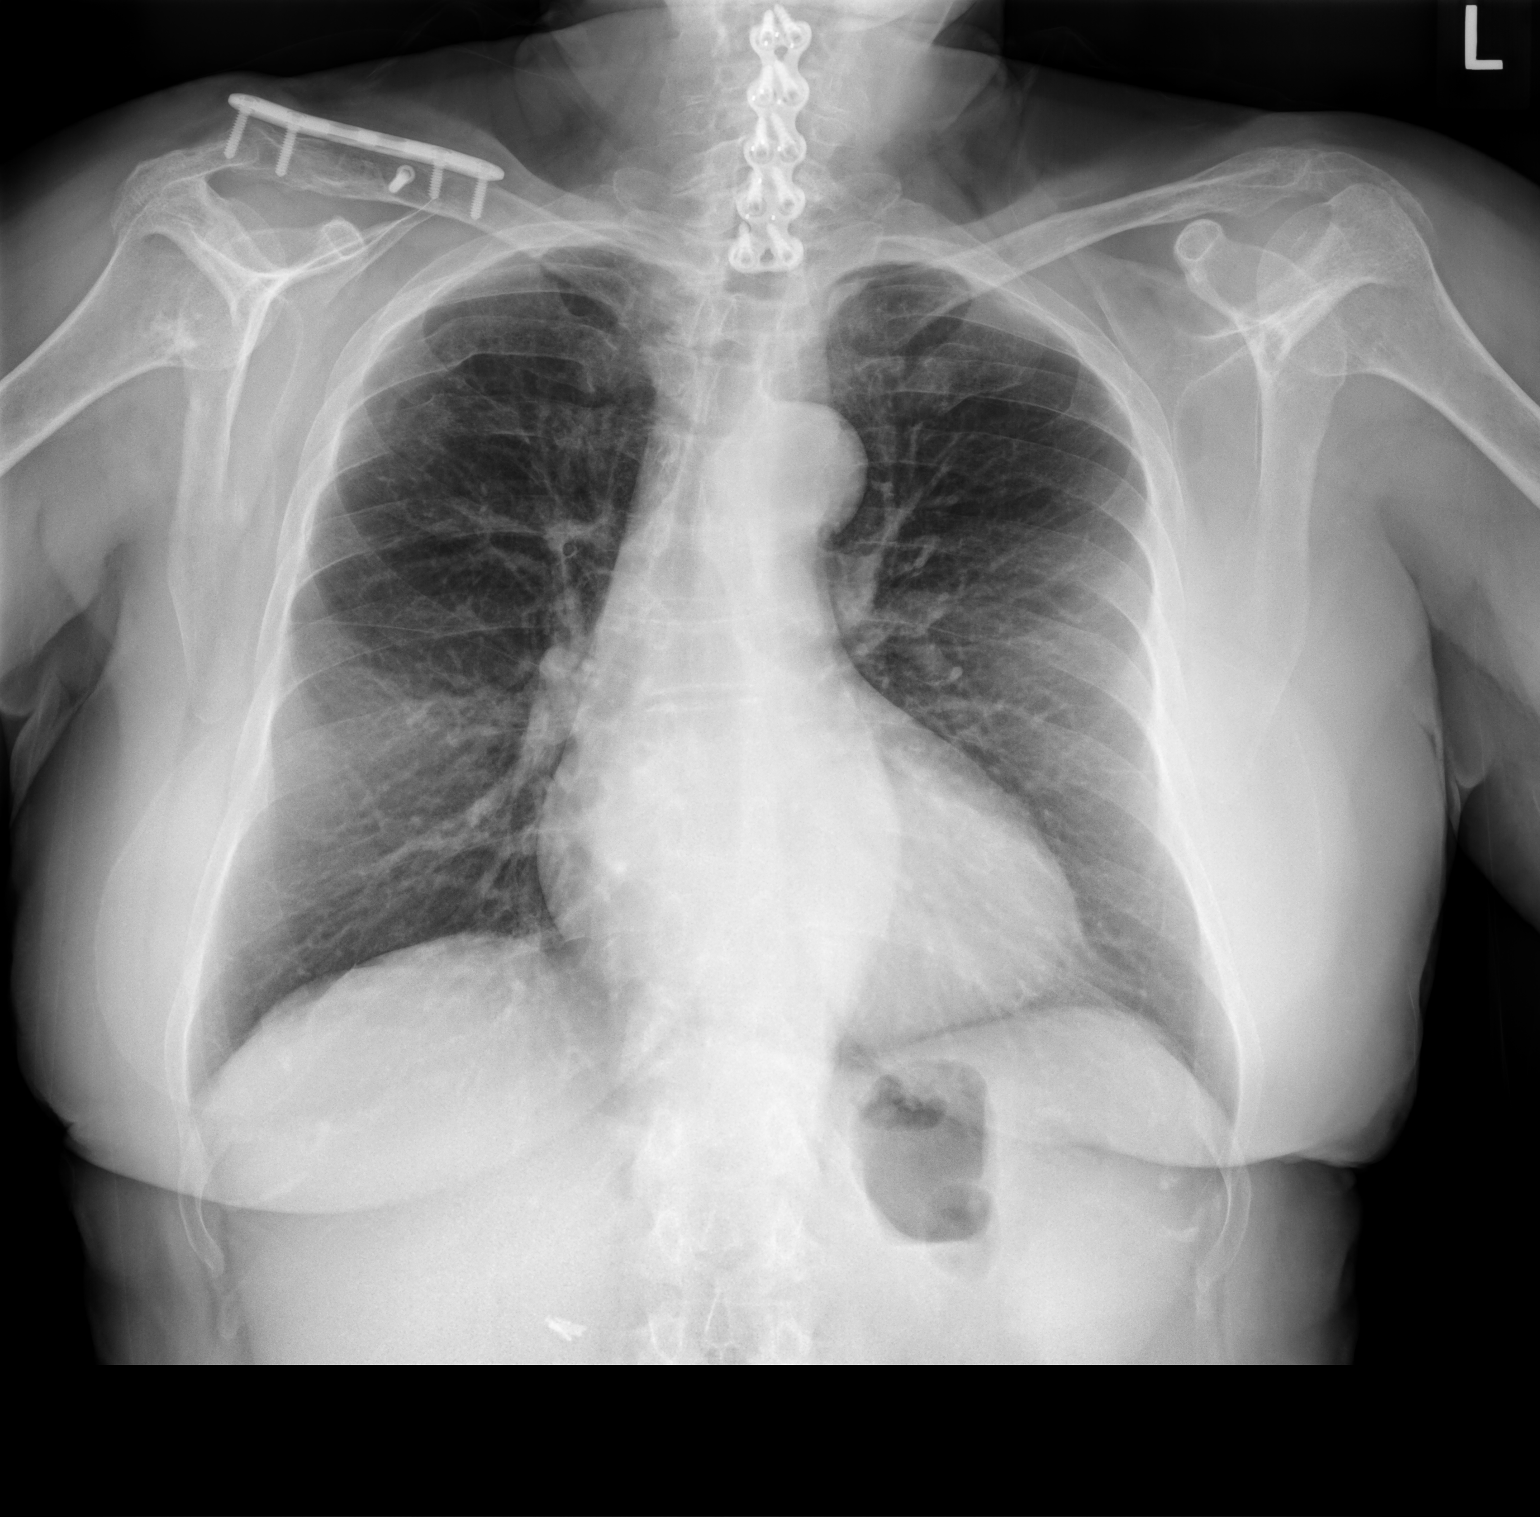

[chest lat]
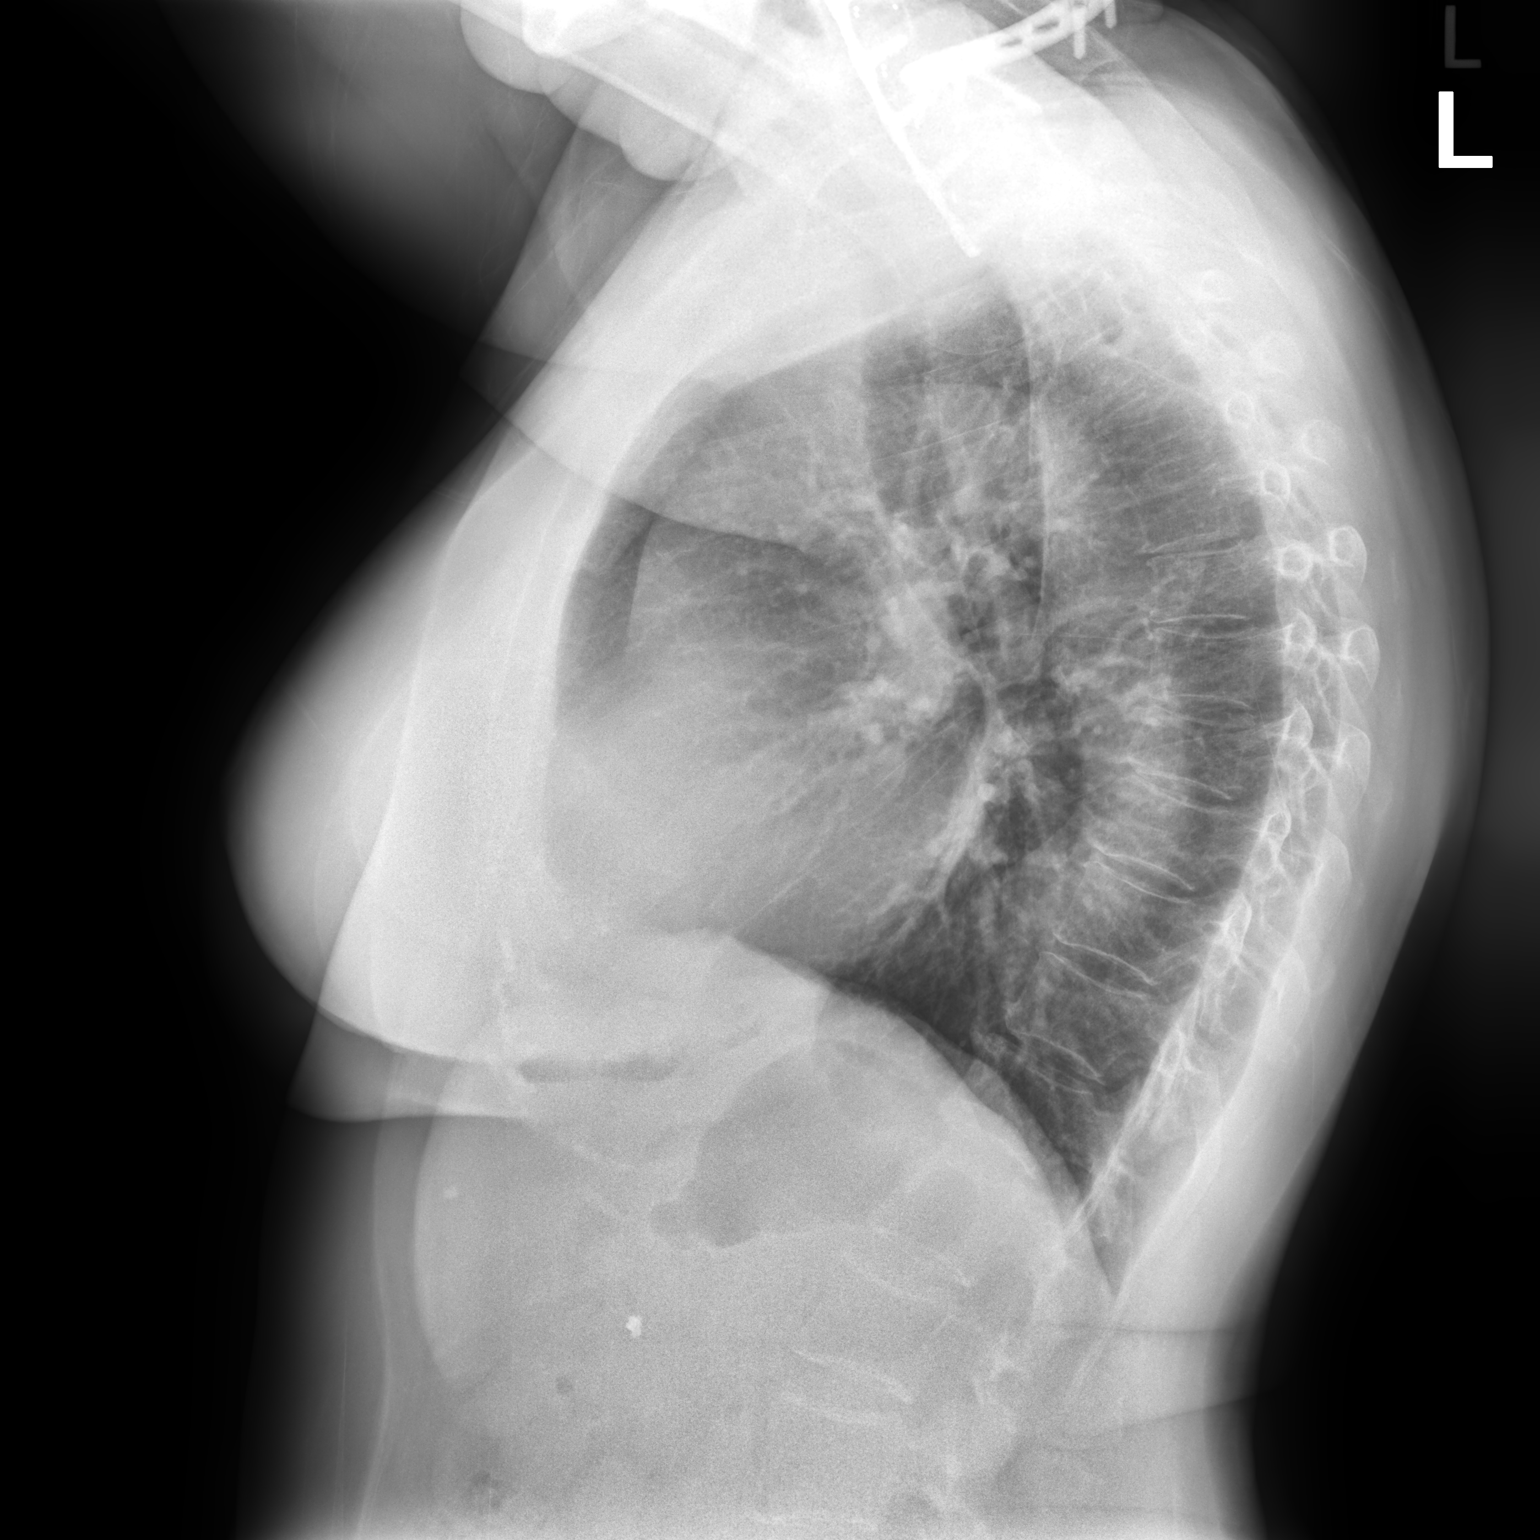

[2 of 2 positions shown; findings below may reference images not displayed]

FINDINGS: Healed right rib fractures. Cardiomediastinal silhouette is normal.
No pneumothorax. No pulmonary nodules, masses, or focal infiltrates.
IMPRESSION: No active cardiopulmonary disease.

## 2021-03-22 DIAGNOSIS — R6883 Chills (without fever): Secondary | ICD-10-CM | POA: Diagnosis not present

## 2021-03-22 DIAGNOSIS — Z03818 Encounter for observation for suspected exposure to other biological agents ruled out: Secondary | ICD-10-CM | POA: Diagnosis not present

## 2021-03-22 DIAGNOSIS — J029 Acute pharyngitis, unspecified: Secondary | ICD-10-CM | POA: Diagnosis not present

## 2021-04-23 DIAGNOSIS — Z1272 Encounter for screening for malignant neoplasm of vagina: Secondary | ICD-10-CM | POA: Diagnosis not present

## 2021-04-23 DIAGNOSIS — K649 Unspecified hemorrhoids: Secondary | ICD-10-CM | POA: Diagnosis not present

## 2021-04-23 DIAGNOSIS — Z6829 Body mass index (BMI) 29.0-29.9, adult: Secondary | ICD-10-CM | POA: Diagnosis not present

## 2021-04-23 DIAGNOSIS — Z124 Encounter for screening for malignant neoplasm of cervix: Secondary | ICD-10-CM | POA: Diagnosis not present

## 2021-05-21 DIAGNOSIS — M1712 Unilateral primary osteoarthritis, left knee: Secondary | ICD-10-CM | POA: Diagnosis not present

## 2021-05-21 DIAGNOSIS — M170 Bilateral primary osteoarthritis of knee: Secondary | ICD-10-CM | POA: Diagnosis not present

## 2021-05-21 DIAGNOSIS — M1711 Unilateral primary osteoarthritis, right knee: Secondary | ICD-10-CM | POA: Diagnosis not present

## 2021-05-21 DIAGNOSIS — M25562 Pain in left knee: Secondary | ICD-10-CM | POA: Diagnosis not present

## 2021-05-21 DIAGNOSIS — M25561 Pain in right knee: Secondary | ICD-10-CM | POA: Diagnosis not present

## 2021-06-26 DIAGNOSIS — Z20822 Contact with and (suspected) exposure to covid-19: Secondary | ICD-10-CM | POA: Diagnosis not present

## 2021-07-09 DIAGNOSIS — M8588 Other specified disorders of bone density and structure, other site: Secondary | ICD-10-CM | POA: Diagnosis not present

## 2021-07-09 DIAGNOSIS — Z7689 Persons encountering health services in other specified circumstances: Secondary | ICD-10-CM | POA: Diagnosis not present

## 2021-07-09 DIAGNOSIS — E039 Hypothyroidism, unspecified: Secondary | ICD-10-CM | POA: Diagnosis not present

## 2021-07-09 DIAGNOSIS — Z1231 Encounter for screening mammogram for malignant neoplasm of breast: Secondary | ICD-10-CM | POA: Diagnosis not present

## 2021-07-09 DIAGNOSIS — N958 Other specified menopausal and perimenopausal disorders: Secondary | ICD-10-CM | POA: Diagnosis not present

## 2021-07-29 DIAGNOSIS — Z20828 Contact with and (suspected) exposure to other viral communicable diseases: Secondary | ICD-10-CM | POA: Diagnosis not present

## 2021-08-27 DIAGNOSIS — R7303 Prediabetes: Secondary | ICD-10-CM | POA: Diagnosis not present

## 2021-08-27 DIAGNOSIS — E039 Hypothyroidism, unspecified: Secondary | ICD-10-CM | POA: Diagnosis not present

## 2021-08-27 DIAGNOSIS — J4530 Mild persistent asthma, uncomplicated: Secondary | ICD-10-CM | POA: Diagnosis not present

## 2021-08-27 DIAGNOSIS — Z1389 Encounter for screening for other disorder: Secondary | ICD-10-CM | POA: Diagnosis not present

## 2021-08-27 DIAGNOSIS — Z0000 Encounter for general adult medical examination without abnormal findings: Secondary | ICD-10-CM | POA: Diagnosis not present

## 2021-08-27 DIAGNOSIS — R609 Edema, unspecified: Secondary | ICD-10-CM | POA: Diagnosis not present

## 2021-08-27 DIAGNOSIS — Z23 Encounter for immunization: Secondary | ICD-10-CM | POA: Diagnosis not present

## 2021-08-27 DIAGNOSIS — I10 Essential (primary) hypertension: Secondary | ICD-10-CM | POA: Diagnosis not present

## 2021-08-27 DIAGNOSIS — E7800 Pure hypercholesterolemia, unspecified: Secondary | ICD-10-CM | POA: Diagnosis not present

## 2021-09-10 DIAGNOSIS — M25562 Pain in left knee: Secondary | ICD-10-CM | POA: Diagnosis not present

## 2021-09-10 DIAGNOSIS — M25561 Pain in right knee: Secondary | ICD-10-CM | POA: Diagnosis not present

## 2021-09-21 DIAGNOSIS — M25562 Pain in left knee: Secondary | ICD-10-CM | POA: Diagnosis not present

## 2021-09-24 DIAGNOSIS — S83242A Other tear of medial meniscus, current injury, left knee, initial encounter: Secondary | ICD-10-CM | POA: Diagnosis not present

## 2021-09-24 DIAGNOSIS — M1712 Unilateral primary osteoarthritis, left knee: Secondary | ICD-10-CM | POA: Diagnosis not present

## 2021-09-30 DIAGNOSIS — Z20822 Contact with and (suspected) exposure to covid-19: Secondary | ICD-10-CM | POA: Diagnosis not present

## 2021-10-29 DIAGNOSIS — E038 Other specified hypothyroidism: Secondary | ICD-10-CM | POA: Diagnosis not present

## 2021-11-14 DIAGNOSIS — Z1152 Encounter for screening for COVID-19: Secondary | ICD-10-CM | POA: Diagnosis not present

## 2021-11-14 DIAGNOSIS — Z20828 Contact with and (suspected) exposure to other viral communicable diseases: Secondary | ICD-10-CM | POA: Diagnosis not present

## 2021-11-15 DIAGNOSIS — Z20822 Contact with and (suspected) exposure to covid-19: Secondary | ICD-10-CM | POA: Diagnosis not present

## 2021-11-22 DIAGNOSIS — R3989 Other symptoms and signs involving the genitourinary system: Secondary | ICD-10-CM | POA: Diagnosis not present

## 2021-11-26 DIAGNOSIS — Z20822 Contact with and (suspected) exposure to covid-19: Secondary | ICD-10-CM | POA: Diagnosis not present

## 2021-12-01 DIAGNOSIS — Z20822 Contact with and (suspected) exposure to covid-19: Secondary | ICD-10-CM | POA: Diagnosis not present

## 2021-12-05 DIAGNOSIS — Z20828 Contact with and (suspected) exposure to other viral communicable diseases: Secondary | ICD-10-CM | POA: Diagnosis not present

## 2021-12-19 DIAGNOSIS — Z20828 Contact with and (suspected) exposure to other viral communicable diseases: Secondary | ICD-10-CM | POA: Diagnosis not present

## 2021-12-25 DIAGNOSIS — Z20828 Contact with and (suspected) exposure to other viral communicable diseases: Secondary | ICD-10-CM | POA: Diagnosis not present

## 2021-12-31 DIAGNOSIS — E039 Hypothyroidism, unspecified: Secondary | ICD-10-CM | POA: Diagnosis not present

## 2022-01-07 DIAGNOSIS — M170 Bilateral primary osteoarthritis of knee: Secondary | ICD-10-CM | POA: Diagnosis not present

## 2022-03-11 DIAGNOSIS — I10 Essential (primary) hypertension: Secondary | ICD-10-CM | POA: Diagnosis not present

## 2022-03-11 DIAGNOSIS — R7303 Prediabetes: Secondary | ICD-10-CM | POA: Diagnosis not present

## 2022-03-11 DIAGNOSIS — M85859 Other specified disorders of bone density and structure, unspecified thigh: Secondary | ICD-10-CM | POA: Diagnosis not present

## 2022-03-11 DIAGNOSIS — E039 Hypothyroidism, unspecified: Secondary | ICD-10-CM | POA: Diagnosis not present

## 2022-03-11 DIAGNOSIS — E7800 Pure hypercholesterolemia, unspecified: Secondary | ICD-10-CM | POA: Diagnosis not present

## 2022-03-11 DIAGNOSIS — R609 Edema, unspecified: Secondary | ICD-10-CM | POA: Diagnosis not present

## 2022-03-11 DIAGNOSIS — J4530 Mild persistent asthma, uncomplicated: Secondary | ICD-10-CM | POA: Diagnosis not present

## 2022-05-27 DIAGNOSIS — R600 Localized edema: Secondary | ICD-10-CM | POA: Diagnosis not present

## 2022-05-27 DIAGNOSIS — M79604 Pain in right leg: Secondary | ICD-10-CM | POA: Diagnosis not present

## 2022-05-27 DIAGNOSIS — I83813 Varicose veins of bilateral lower extremities with pain: Secondary | ICD-10-CM | POA: Diagnosis not present

## 2022-05-27 DIAGNOSIS — M79605 Pain in left leg: Secondary | ICD-10-CM | POA: Diagnosis not present

## 2022-06-03 DIAGNOSIS — R252 Cramp and spasm: Secondary | ICD-10-CM | POA: Diagnosis not present

## 2022-06-03 DIAGNOSIS — G2581 Restless legs syndrome: Secondary | ICD-10-CM | POA: Diagnosis not present

## 2022-06-03 DIAGNOSIS — I87393 Chronic venous hypertension (idiopathic) with other complications of bilateral lower extremity: Secondary | ICD-10-CM | POA: Diagnosis not present

## 2022-06-03 DIAGNOSIS — R600 Localized edema: Secondary | ICD-10-CM | POA: Diagnosis not present

## 2022-06-03 DIAGNOSIS — I83893 Varicose veins of bilateral lower extremities with other complications: Secondary | ICD-10-CM | POA: Diagnosis not present

## 2022-06-12 DIAGNOSIS — M25561 Pain in right knee: Secondary | ICD-10-CM | POA: Diagnosis not present

## 2022-06-12 DIAGNOSIS — M25562 Pain in left knee: Secondary | ICD-10-CM | POA: Diagnosis not present

## 2022-06-12 DIAGNOSIS — M170 Bilateral primary osteoarthritis of knee: Secondary | ICD-10-CM | POA: Diagnosis not present

## 2022-07-02 DIAGNOSIS — I83891 Varicose veins of right lower extremities with other complications: Secondary | ICD-10-CM | POA: Diagnosis not present

## 2022-07-15 DIAGNOSIS — I87391 Chronic venous hypertension (idiopathic) with other complications of right lower extremity: Secondary | ICD-10-CM | POA: Diagnosis not present

## 2022-07-15 DIAGNOSIS — I83891 Varicose veins of right lower extremities with other complications: Secondary | ICD-10-CM | POA: Diagnosis not present

## 2022-08-26 DIAGNOSIS — I83892 Varicose veins of left lower extremities with other complications: Secondary | ICD-10-CM | POA: Diagnosis not present

## 2022-09-02 DIAGNOSIS — I83891 Varicose veins of right lower extremities with other complications: Secondary | ICD-10-CM | POA: Diagnosis not present

## 2022-09-02 DIAGNOSIS — I87391 Chronic venous hypertension (idiopathic) with other complications of right lower extremity: Secondary | ICD-10-CM | POA: Diagnosis not present

## 2022-09-09 DIAGNOSIS — Z1389 Encounter for screening for other disorder: Secondary | ICD-10-CM | POA: Diagnosis not present

## 2022-09-09 DIAGNOSIS — E039 Hypothyroidism, unspecified: Secondary | ICD-10-CM | POA: Diagnosis not present

## 2022-09-09 DIAGNOSIS — R7303 Prediabetes: Secondary | ICD-10-CM | POA: Diagnosis not present

## 2022-09-09 DIAGNOSIS — E8889 Other specified metabolic disorders: Secondary | ICD-10-CM | POA: Diagnosis not present

## 2022-09-09 DIAGNOSIS — I10 Essential (primary) hypertension: Secondary | ICD-10-CM | POA: Diagnosis not present

## 2022-09-09 DIAGNOSIS — Z6829 Body mass index (BMI) 29.0-29.9, adult: Secondary | ICD-10-CM | POA: Diagnosis not present

## 2022-09-09 DIAGNOSIS — Z79899 Other long term (current) drug therapy: Secondary | ICD-10-CM | POA: Diagnosis not present

## 2022-09-09 DIAGNOSIS — E663 Overweight: Secondary | ICD-10-CM | POA: Diagnosis not present

## 2022-09-09 DIAGNOSIS — Z1331 Encounter for screening for depression: Secondary | ICD-10-CM | POA: Diagnosis not present

## 2022-09-13 DIAGNOSIS — M25562 Pain in left knee: Secondary | ICD-10-CM | POA: Diagnosis not present

## 2022-09-16 DIAGNOSIS — H2513 Age-related nuclear cataract, bilateral: Secondary | ICD-10-CM | POA: Diagnosis not present

## 2022-09-16 DIAGNOSIS — H5203 Hypermetropia, bilateral: Secondary | ICD-10-CM | POA: Diagnosis not present

## 2022-09-16 DIAGNOSIS — H524 Presbyopia: Secondary | ICD-10-CM | POA: Diagnosis not present

## 2022-09-23 DIAGNOSIS — I87391 Chronic venous hypertension (idiopathic) with other complications of right lower extremity: Secondary | ICD-10-CM | POA: Diagnosis not present

## 2022-09-23 DIAGNOSIS — E663 Overweight: Secondary | ICD-10-CM | POA: Diagnosis not present

## 2022-09-23 DIAGNOSIS — R7303 Prediabetes: Secondary | ICD-10-CM | POA: Diagnosis not present

## 2022-09-23 DIAGNOSIS — I10 Essential (primary) hypertension: Secondary | ICD-10-CM | POA: Diagnosis not present

## 2022-09-23 DIAGNOSIS — I83891 Varicose veins of right lower extremities with other complications: Secondary | ICD-10-CM | POA: Diagnosis not present

## 2022-09-23 DIAGNOSIS — Z6829 Body mass index (BMI) 29.0-29.9, adult: Secondary | ICD-10-CM | POA: Diagnosis not present

## 2022-09-30 DIAGNOSIS — I83892 Varicose veins of left lower extremities with other complications: Secondary | ICD-10-CM | POA: Diagnosis not present

## 2022-10-02 DIAGNOSIS — M170 Bilateral primary osteoarthritis of knee: Secondary | ICD-10-CM | POA: Diagnosis not present

## 2022-10-07 DIAGNOSIS — I10 Essential (primary) hypertension: Secondary | ICD-10-CM | POA: Diagnosis not present

## 2022-10-07 DIAGNOSIS — E663 Overweight: Secondary | ICD-10-CM | POA: Diagnosis not present

## 2022-10-07 DIAGNOSIS — R7303 Prediabetes: Secondary | ICD-10-CM | POA: Diagnosis not present

## 2022-10-07 DIAGNOSIS — Z6828 Body mass index (BMI) 28.0-28.9, adult: Secondary | ICD-10-CM | POA: Diagnosis not present

## 2022-10-09 DIAGNOSIS — I862 Pelvic varices: Secondary | ICD-10-CM | POA: Diagnosis not present

## 2022-10-09 DIAGNOSIS — N941 Unspecified dyspareunia: Secondary | ICD-10-CM | POA: Diagnosis not present

## 2022-10-09 DIAGNOSIS — R102 Pelvic and perineal pain: Secondary | ICD-10-CM | POA: Diagnosis not present

## 2022-10-14 DIAGNOSIS — I862 Pelvic varices: Secondary | ICD-10-CM | POA: Diagnosis not present

## 2022-10-14 DIAGNOSIS — Z9049 Acquired absence of other specified parts of digestive tract: Secondary | ICD-10-CM | POA: Diagnosis not present

## 2022-10-14 DIAGNOSIS — N281 Cyst of kidney, acquired: Secondary | ICD-10-CM | POA: Diagnosis not present

## 2022-10-14 DIAGNOSIS — K7689 Other specified diseases of liver: Secondary | ICD-10-CM | POA: Diagnosis not present

## 2022-10-14 DIAGNOSIS — K449 Diaphragmatic hernia without obstruction or gangrene: Secondary | ICD-10-CM | POA: Diagnosis not present

## 2022-10-14 DIAGNOSIS — I83891 Varicose veins of right lower extremities with other complications: Secondary | ICD-10-CM | POA: Diagnosis not present

## 2022-10-21 DIAGNOSIS — I83892 Varicose veins of left lower extremities with other complications: Secondary | ICD-10-CM | POA: Diagnosis not present

## 2022-10-28 DIAGNOSIS — Z1272 Encounter for screening for malignant neoplasm of vagina: Secondary | ICD-10-CM | POA: Diagnosis not present

## 2022-10-28 DIAGNOSIS — Z1231 Encounter for screening mammogram for malignant neoplasm of breast: Secondary | ICD-10-CM | POA: Diagnosis not present

## 2022-11-04 DIAGNOSIS — Z0000 Encounter for general adult medical examination without abnormal findings: Secondary | ICD-10-CM | POA: Diagnosis not present

## 2022-11-04 DIAGNOSIS — I10 Essential (primary) hypertension: Secondary | ICD-10-CM | POA: Diagnosis not present

## 2022-11-04 DIAGNOSIS — E663 Overweight: Secondary | ICD-10-CM | POA: Diagnosis not present

## 2022-11-04 DIAGNOSIS — Z1331 Encounter for screening for depression: Secondary | ICD-10-CM | POA: Diagnosis not present

## 2022-11-04 DIAGNOSIS — R609 Edema, unspecified: Secondary | ICD-10-CM | POA: Diagnosis not present

## 2022-11-04 DIAGNOSIS — E039 Hypothyroidism, unspecified: Secondary | ICD-10-CM | POA: Diagnosis not present

## 2022-11-04 DIAGNOSIS — E7800 Pure hypercholesterolemia, unspecified: Secondary | ICD-10-CM | POA: Diagnosis not present

## 2022-11-04 DIAGNOSIS — R7309 Other abnormal glucose: Secondary | ICD-10-CM | POA: Diagnosis not present

## 2022-11-13 DIAGNOSIS — Z6827 Body mass index (BMI) 27.0-27.9, adult: Secondary | ICD-10-CM | POA: Diagnosis not present

## 2022-11-13 DIAGNOSIS — R7303 Prediabetes: Secondary | ICD-10-CM | POA: Diagnosis not present

## 2022-11-13 DIAGNOSIS — E663 Overweight: Secondary | ICD-10-CM | POA: Diagnosis not present

## 2022-11-13 DIAGNOSIS — I10 Essential (primary) hypertension: Secondary | ICD-10-CM | POA: Diagnosis not present

## 2022-11-15 DIAGNOSIS — N390 Urinary tract infection, site not specified: Secondary | ICD-10-CM | POA: Diagnosis not present

## 2022-12-12 DIAGNOSIS — R7303 Prediabetes: Secondary | ICD-10-CM | POA: Diagnosis not present

## 2022-12-12 DIAGNOSIS — E663 Overweight: Secondary | ICD-10-CM | POA: Diagnosis not present

## 2022-12-12 DIAGNOSIS — Z6827 Body mass index (BMI) 27.0-27.9, adult: Secondary | ICD-10-CM | POA: Diagnosis not present

## 2022-12-12 DIAGNOSIS — I10 Essential (primary) hypertension: Secondary | ICD-10-CM | POA: Diagnosis not present

## 2023-01-09 DIAGNOSIS — E663 Overweight: Secondary | ICD-10-CM | POA: Diagnosis not present

## 2023-01-09 DIAGNOSIS — I10 Essential (primary) hypertension: Secondary | ICD-10-CM | POA: Diagnosis not present

## 2023-01-09 DIAGNOSIS — E039 Hypothyroidism, unspecified: Secondary | ICD-10-CM | POA: Diagnosis not present

## 2023-01-09 DIAGNOSIS — R7303 Prediabetes: Secondary | ICD-10-CM | POA: Diagnosis not present

## 2023-01-09 DIAGNOSIS — E559 Vitamin D deficiency, unspecified: Secondary | ICD-10-CM | POA: Diagnosis not present

## 2023-01-09 DIAGNOSIS — Z6827 Body mass index (BMI) 27.0-27.9, adult: Secondary | ICD-10-CM | POA: Diagnosis not present

## 2023-01-16 DIAGNOSIS — I10 Essential (primary) hypertension: Secondary | ICD-10-CM | POA: Diagnosis not present

## 2023-01-16 DIAGNOSIS — R399 Unspecified symptoms and signs involving the genitourinary system: Secondary | ICD-10-CM | POA: Diagnosis not present

## 2023-02-13 DIAGNOSIS — M170 Bilateral primary osteoarthritis of knee: Secondary | ICD-10-CM | POA: Diagnosis not present

## 2023-03-03 DIAGNOSIS — I87393 Chronic venous hypertension (idiopathic) with other complications of bilateral lower extremity: Secondary | ICD-10-CM | POA: Diagnosis not present

## 2023-03-03 DIAGNOSIS — R252 Cramp and spasm: Secondary | ICD-10-CM | POA: Diagnosis not present

## 2023-03-03 DIAGNOSIS — R600 Localized edema: Secondary | ICD-10-CM | POA: Diagnosis not present

## 2023-03-03 DIAGNOSIS — G2581 Restless legs syndrome: Secondary | ICD-10-CM | POA: Diagnosis not present

## 2023-03-03 DIAGNOSIS — I83893 Varicose veins of bilateral lower extremities with other complications: Secondary | ICD-10-CM | POA: Diagnosis not present

## 2023-03-19 DIAGNOSIS — I83891 Varicose veins of right lower extremities with other complications: Secondary | ICD-10-CM | POA: Diagnosis not present

## 2023-03-24 DIAGNOSIS — I83891 Varicose veins of right lower extremities with other complications: Secondary | ICD-10-CM | POA: Diagnosis not present

## 2023-03-24 DIAGNOSIS — I87391 Chronic venous hypertension (idiopathic) with other complications of right lower extremity: Secondary | ICD-10-CM | POA: Diagnosis not present

## 2023-03-24 DIAGNOSIS — Z09 Encounter for follow-up examination after completed treatment for conditions other than malignant neoplasm: Secondary | ICD-10-CM | POA: Diagnosis not present

## 2023-04-07 DIAGNOSIS — N3020 Other chronic cystitis without hematuria: Secondary | ICD-10-CM | POA: Diagnosis not present

## 2023-04-07 DIAGNOSIS — N952 Postmenopausal atrophic vaginitis: Secondary | ICD-10-CM | POA: Diagnosis not present

## 2023-04-14 DIAGNOSIS — I83892 Varicose veins of left lower extremities with other complications: Secondary | ICD-10-CM | POA: Diagnosis not present

## 2023-04-14 DIAGNOSIS — I87392 Chronic venous hypertension (idiopathic) with other complications of left lower extremity: Secondary | ICD-10-CM | POA: Diagnosis not present

## 2023-04-14 DIAGNOSIS — Z09 Encounter for follow-up examination after completed treatment for conditions other than malignant neoplasm: Secondary | ICD-10-CM | POA: Diagnosis not present

## 2023-05-05 DIAGNOSIS — I83892 Varicose veins of left lower extremities with other complications: Secondary | ICD-10-CM | POA: Diagnosis not present

## 2023-05-05 DIAGNOSIS — I8312 Varicose veins of left lower extremity with inflammation: Secondary | ICD-10-CM | POA: Diagnosis not present

## 2023-05-12 DIAGNOSIS — I872 Venous insufficiency (chronic) (peripheral): Secondary | ICD-10-CM | POA: Diagnosis not present

## 2023-05-12 DIAGNOSIS — I8311 Varicose veins of right lower extremity with inflammation: Secondary | ICD-10-CM | POA: Diagnosis not present

## 2023-05-15 DIAGNOSIS — M170 Bilateral primary osteoarthritis of knee: Secondary | ICD-10-CM | POA: Diagnosis not present

## 2023-05-26 DIAGNOSIS — N390 Urinary tract infection, site not specified: Secondary | ICD-10-CM | POA: Diagnosis not present

## 2023-05-26 DIAGNOSIS — J4530 Mild persistent asthma, uncomplicated: Secondary | ICD-10-CM | POA: Diagnosis not present

## 2023-05-26 DIAGNOSIS — R609 Edema, unspecified: Secondary | ICD-10-CM | POA: Diagnosis not present

## 2023-05-26 DIAGNOSIS — I10 Essential (primary) hypertension: Secondary | ICD-10-CM | POA: Diagnosis not present

## 2023-05-26 DIAGNOSIS — E559 Vitamin D deficiency, unspecified: Secondary | ICD-10-CM | POA: Diagnosis not present

## 2023-05-26 DIAGNOSIS — R7303 Prediabetes: Secondary | ICD-10-CM | POA: Diagnosis not present

## 2023-05-26 DIAGNOSIS — E039 Hypothyroidism, unspecified: Secondary | ICD-10-CM | POA: Diagnosis not present

## 2023-07-14 DIAGNOSIS — E039 Hypothyroidism, unspecified: Secondary | ICD-10-CM | POA: Diagnosis not present

## 2023-07-14 DIAGNOSIS — E559 Vitamin D deficiency, unspecified: Secondary | ICD-10-CM | POA: Diagnosis not present

## 2023-08-09 DIAGNOSIS — I10 Essential (primary) hypertension: Secondary | ICD-10-CM | POA: Diagnosis not present

## 2023-08-09 DIAGNOSIS — U071 COVID-19: Secondary | ICD-10-CM | POA: Diagnosis not present

## 2023-10-16 DIAGNOSIS — I10 Essential (primary) hypertension: Secondary | ICD-10-CM | POA: Diagnosis not present

## 2023-10-20 DIAGNOSIS — M170 Bilateral primary osteoarthritis of knee: Secondary | ICD-10-CM | POA: Diagnosis not present

## 2023-10-27 DIAGNOSIS — E039 Hypothyroidism, unspecified: Secondary | ICD-10-CM | POA: Diagnosis not present

## 2023-10-27 DIAGNOSIS — I10 Essential (primary) hypertension: Secondary | ICD-10-CM | POA: Diagnosis not present

## 2023-10-27 DIAGNOSIS — E663 Overweight: Secondary | ICD-10-CM | POA: Diagnosis not present

## 2023-10-27 DIAGNOSIS — J4530 Mild persistent asthma, uncomplicated: Secondary | ICD-10-CM | POA: Diagnosis not present

## 2023-11-10 DIAGNOSIS — Z1331 Encounter for screening for depression: Secondary | ICD-10-CM | POA: Diagnosis not present

## 2023-11-10 DIAGNOSIS — R609 Edema, unspecified: Secondary | ICD-10-CM | POA: Diagnosis not present

## 2023-11-10 DIAGNOSIS — E559 Vitamin D deficiency, unspecified: Secondary | ICD-10-CM | POA: Diagnosis not present

## 2023-11-10 DIAGNOSIS — J4530 Mild persistent asthma, uncomplicated: Secondary | ICD-10-CM | POA: Diagnosis not present

## 2023-11-10 DIAGNOSIS — Z13820 Encounter for screening for osteoporosis: Secondary | ICD-10-CM | POA: Diagnosis not present

## 2023-11-10 DIAGNOSIS — R1013 Epigastric pain: Secondary | ICD-10-CM | POA: Diagnosis not present

## 2023-11-10 DIAGNOSIS — Z0000 Encounter for general adult medical examination without abnormal findings: Secondary | ICD-10-CM | POA: Diagnosis not present

## 2023-11-10 DIAGNOSIS — Z1211 Encounter for screening for malignant neoplasm of colon: Secondary | ICD-10-CM | POA: Diagnosis not present

## 2023-11-10 DIAGNOSIS — I10 Essential (primary) hypertension: Secondary | ICD-10-CM | POA: Diagnosis not present

## 2023-11-10 DIAGNOSIS — E039 Hypothyroidism, unspecified: Secondary | ICD-10-CM | POA: Diagnosis not present

## 2023-11-10 DIAGNOSIS — R7303 Prediabetes: Secondary | ICD-10-CM | POA: Diagnosis not present

## 2023-11-10 DIAGNOSIS — E7800 Pure hypercholesterolemia, unspecified: Secondary | ICD-10-CM | POA: Diagnosis not present

## 2023-11-11 ENCOUNTER — Other Ambulatory Visit: Payer: Self-pay | Admitting: Family Medicine

## 2023-11-11 DIAGNOSIS — E2839 Other primary ovarian failure: Secondary | ICD-10-CM

## 2023-11-23 DIAGNOSIS — I10 Essential (primary) hypertension: Secondary | ICD-10-CM | POA: Diagnosis not present

## 2023-11-26 DIAGNOSIS — E663 Overweight: Secondary | ICD-10-CM | POA: Diagnosis not present

## 2023-11-26 DIAGNOSIS — E039 Hypothyroidism, unspecified: Secondary | ICD-10-CM | POA: Diagnosis not present

## 2023-11-26 DIAGNOSIS — I10 Essential (primary) hypertension: Secondary | ICD-10-CM | POA: Diagnosis not present

## 2023-11-26 DIAGNOSIS — J4530 Mild persistent asthma, uncomplicated: Secondary | ICD-10-CM | POA: Diagnosis not present

## 2023-11-30 DIAGNOSIS — R109 Unspecified abdominal pain: Secondary | ICD-10-CM | POA: Diagnosis not present

## 2023-11-30 DIAGNOSIS — R509 Fever, unspecified: Secondary | ICD-10-CM | POA: Diagnosis not present

## 2023-11-30 DIAGNOSIS — I10 Essential (primary) hypertension: Secondary | ICD-10-CM | POA: Diagnosis not present

## 2023-11-30 DIAGNOSIS — N390 Urinary tract infection, site not specified: Secondary | ICD-10-CM | POA: Diagnosis not present

## 2023-11-30 DIAGNOSIS — R197 Diarrhea, unspecified: Secondary | ICD-10-CM | POA: Diagnosis not present

## 2023-11-30 DIAGNOSIS — R1110 Vomiting, unspecified: Secondary | ICD-10-CM | POA: Diagnosis not present

## 2023-12-23 DIAGNOSIS — I10 Essential (primary) hypertension: Secondary | ICD-10-CM | POA: Diagnosis not present

## 2023-12-27 DIAGNOSIS — I10 Essential (primary) hypertension: Secondary | ICD-10-CM | POA: Diagnosis not present

## 2024-01-22 DIAGNOSIS — I10 Essential (primary) hypertension: Secondary | ICD-10-CM | POA: Diagnosis not present

## 2024-01-26 DIAGNOSIS — E039 Hypothyroidism, unspecified: Secondary | ICD-10-CM | POA: Diagnosis not present

## 2024-01-26 DIAGNOSIS — J4530 Mild persistent asthma, uncomplicated: Secondary | ICD-10-CM | POA: Diagnosis not present

## 2024-01-26 DIAGNOSIS — I10 Essential (primary) hypertension: Secondary | ICD-10-CM | POA: Diagnosis not present

## 2024-01-26 DIAGNOSIS — E663 Overweight: Secondary | ICD-10-CM | POA: Diagnosis not present

## 2024-02-01 DIAGNOSIS — S61411A Laceration without foreign body of right hand, initial encounter: Secondary | ICD-10-CM | POA: Diagnosis not present

## 2024-02-11 DIAGNOSIS — Z4802 Encounter for removal of sutures: Secondary | ICD-10-CM | POA: Diagnosis not present

## 2024-02-21 DIAGNOSIS — I10 Essential (primary) hypertension: Secondary | ICD-10-CM | POA: Diagnosis not present

## 2024-02-23 DIAGNOSIS — M170 Bilateral primary osteoarthritis of knee: Secondary | ICD-10-CM | POA: Diagnosis not present

## 2024-02-26 DIAGNOSIS — E663 Overweight: Secondary | ICD-10-CM | POA: Diagnosis not present

## 2024-02-26 DIAGNOSIS — I10 Essential (primary) hypertension: Secondary | ICD-10-CM | POA: Diagnosis not present

## 2024-02-26 DIAGNOSIS — E039 Hypothyroidism, unspecified: Secondary | ICD-10-CM | POA: Diagnosis not present

## 2024-02-26 DIAGNOSIS — J4530 Mild persistent asthma, uncomplicated: Secondary | ICD-10-CM | POA: Diagnosis not present

## 2024-03-01 ENCOUNTER — Ambulatory Visit (HOSPITAL_BASED_OUTPATIENT_CLINIC_OR_DEPARTMENT_OTHER)
Admission: RE | Admit: 2024-03-01 | Discharge: 2024-03-01 | Disposition: A | Discharge status: Home / Self Care | Source: Ambulatory Visit | Attending: Family Medicine | Admitting: Family Medicine

## 2024-03-01 DIAGNOSIS — M810 Age-related osteoporosis without current pathological fracture: Secondary | ICD-10-CM | POA: Diagnosis not present

## 2024-03-01 DIAGNOSIS — E2839 Other primary ovarian failure: Secondary | ICD-10-CM | POA: Insufficient documentation

## 2024-03-01 DIAGNOSIS — Z780 Asymptomatic menopausal state: Secondary | ICD-10-CM | POA: Diagnosis not present

## 2024-03-04 DIAGNOSIS — I10 Essential (primary) hypertension: Secondary | ICD-10-CM | POA: Diagnosis not present

## 2024-03-04 DIAGNOSIS — R300 Dysuria: Secondary | ICD-10-CM | POA: Diagnosis not present

## 2024-03-04 DIAGNOSIS — R350 Frequency of micturition: Secondary | ICD-10-CM | POA: Diagnosis not present

## 2024-03-04 DIAGNOSIS — R399 Unspecified symptoms and signs involving the genitourinary system: Secondary | ICD-10-CM | POA: Diagnosis not present

## 2024-03-08 DIAGNOSIS — M810 Age-related osteoporosis without current pathological fracture: Secondary | ICD-10-CM | POA: Diagnosis not present

## 2024-03-08 DIAGNOSIS — M8589 Other specified disorders of bone density and structure, multiple sites: Secondary | ICD-10-CM | POA: Diagnosis not present

## 2024-03-15 DIAGNOSIS — N952 Postmenopausal atrophic vaginitis: Secondary | ICD-10-CM | POA: Diagnosis not present

## 2024-03-15 DIAGNOSIS — M810 Age-related osteoporosis without current pathological fracture: Secondary | ICD-10-CM | POA: Diagnosis not present

## 2024-03-15 DIAGNOSIS — N3020 Other chronic cystitis without hematuria: Secondary | ICD-10-CM | POA: Diagnosis not present

## 2024-03-22 DIAGNOSIS — I10 Essential (primary) hypertension: Secondary | ICD-10-CM | POA: Diagnosis not present

## 2024-03-28 DIAGNOSIS — I10 Essential (primary) hypertension: Secondary | ICD-10-CM | POA: Diagnosis not present

## 2024-03-28 DIAGNOSIS — E039 Hypothyroidism, unspecified: Secondary | ICD-10-CM | POA: Diagnosis not present

## 2024-03-28 DIAGNOSIS — J4530 Mild persistent asthma, uncomplicated: Secondary | ICD-10-CM | POA: Diagnosis not present

## 2024-03-28 DIAGNOSIS — E663 Overweight: Secondary | ICD-10-CM | POA: Diagnosis not present

## 2024-04-05 DIAGNOSIS — R935 Abnormal findings on diagnostic imaging of other abdominal regions, including retroperitoneum: Secondary | ICD-10-CM | POA: Diagnosis not present

## 2024-04-05 DIAGNOSIS — N200 Calculus of kidney: Secondary | ICD-10-CM | POA: Diagnosis not present

## 2024-04-05 DIAGNOSIS — K4090 Unilateral inguinal hernia, without obstruction or gangrene, not specified as recurrent: Secondary | ICD-10-CM | POA: Diagnosis not present

## 2024-04-05 DIAGNOSIS — R932 Abnormal findings on diagnostic imaging of liver and biliary tract: Secondary | ICD-10-CM | POA: Diagnosis not present

## 2024-04-21 DIAGNOSIS — I10 Essential (primary) hypertension: Secondary | ICD-10-CM | POA: Diagnosis not present

## 2024-04-27 DIAGNOSIS — E039 Hypothyroidism, unspecified: Secondary | ICD-10-CM | POA: Diagnosis not present

## 2024-04-27 DIAGNOSIS — J4530 Mild persistent asthma, uncomplicated: Secondary | ICD-10-CM | POA: Diagnosis not present

## 2024-04-27 DIAGNOSIS — I10 Essential (primary) hypertension: Secondary | ICD-10-CM | POA: Diagnosis not present

## 2024-04-27 DIAGNOSIS — E663 Overweight: Secondary | ICD-10-CM | POA: Diagnosis not present

## 2024-05-08 DIAGNOSIS — N390 Urinary tract infection, site not specified: Secondary | ICD-10-CM | POA: Diagnosis not present

## 2024-05-11 ENCOUNTER — Emergency Department (HOSPITAL_BASED_OUTPATIENT_CLINIC_OR_DEPARTMENT_OTHER)

## 2024-05-11 ENCOUNTER — Other Ambulatory Visit: Payer: Self-pay

## 2024-05-11 ENCOUNTER — Emergency Department (HOSPITAL_BASED_OUTPATIENT_CLINIC_OR_DEPARTMENT_OTHER)
Admission: EM | Admit: 2024-05-11 | Discharge: 2024-05-11 | Disposition: A | Discharge status: Home / Self Care | Source: Ambulatory Visit | Attending: Emergency Medicine | Admitting: Emergency Medicine

## 2024-05-11 ENCOUNTER — Encounter (HOSPITAL_BASED_OUTPATIENT_CLINIC_OR_DEPARTMENT_OTHER): Payer: Self-pay | Admitting: *Deleted

## 2024-05-11 ENCOUNTER — Emergency Department (HOSPITAL_BASED_OUTPATIENT_CLINIC_OR_DEPARTMENT_OTHER): Admitting: Radiology

## 2024-05-11 DIAGNOSIS — Z23 Encounter for immunization: Secondary | ICD-10-CM | POA: Diagnosis not present

## 2024-05-11 DIAGNOSIS — S6992XA Unspecified injury of left wrist, hand and finger(s), initial encounter: Secondary | ICD-10-CM | POA: Diagnosis present

## 2024-05-11 DIAGNOSIS — Y92480 Sidewalk as the place of occurrence of the external cause: Secondary | ICD-10-CM | POA: Diagnosis not present

## 2024-05-11 DIAGNOSIS — Z91040 Latex allergy status: Secondary | ICD-10-CM | POA: Diagnosis not present

## 2024-05-11 DIAGNOSIS — S0031XA Abrasion of nose, initial encounter: Secondary | ICD-10-CM | POA: Diagnosis not present

## 2024-05-11 DIAGNOSIS — T148XXA Other injury of unspecified body region, initial encounter: Secondary | ICD-10-CM

## 2024-05-11 DIAGNOSIS — W101XXA Fall (on)(from) sidewalk curb, initial encounter: Secondary | ICD-10-CM | POA: Insufficient documentation

## 2024-05-11 DIAGNOSIS — M19042 Primary osteoarthritis, left hand: Secondary | ICD-10-CM | POA: Diagnosis not present

## 2024-05-11 DIAGNOSIS — S0990XA Unspecified injury of head, initial encounter: Secondary | ICD-10-CM | POA: Diagnosis present

## 2024-05-11 DIAGNOSIS — W19XXXA Unspecified fall, initial encounter: Secondary | ICD-10-CM

## 2024-05-11 DIAGNOSIS — S0081XA Abrasion of other part of head, initial encounter: Secondary | ICD-10-CM | POA: Diagnosis not present

## 2024-05-11 MED ORDER — BACITRACIN ZINC 500 UNIT/GM EX OINT
TOPICAL_OINTMENT | Freq: Two times a day (BID) | CUTANEOUS | Status: DC
  Administered 2024-05-11: 31.5 via TOPICAL
  Filled 2024-05-11: qty 28.35

## 2024-05-11 MED ORDER — TETANUS-DIPHTH-ACELL PERTUSSIS 5-2-15.5 LF-MCG/0.5 IM SUSP
0.5000 mL | Freq: Once | INTRAMUSCULAR | Status: AC
  Administered 2024-05-11: 0.5 mL via INTRAMUSCULAR
  Filled 2024-05-11: qty 0.5

## 2024-05-11 NOTE — ED Triage Notes (Signed)
 Pt states that she had a fall pta, no LOC.  Pt struck her face and under left ribs and was seen at [facility] and was told to come to ED for CT and xray.  No blood thinners.  No neck pain.;

## 2024-05-11 NOTE — ED Provider Notes (Signed)
 [place] [facility] AT [facility] Provider Note   CSN: [id number] Arrival date & time: 05/11/24  1705    Patient presents with: Debbie Walters Debbie Walters is a 73 y.o. female here for eval mechanical fall.  Just prior to arrival she tripped and fell on a sidewalk and caught the end of her shoe.  She fell on an outstretched left wrist and hit the front of her face on the concrete.  She has some pain underneath her left ribs as well.  Was not having any pain or issues prior to her fall.  Denies any syncope.  She denies any LOC, anticoagulation.  Initially seen at urgent care, referred here for further imaging.  She has no neck or back pain.  No abdominal pain, numbness or weakness.  She is right-hand dominant.  She is in pain to her left wrist, no pain to her left hand, forearm.  Had a prior break in her left wrist previously, feels similar.  Ambulatory PTA  Unsure tetanus.  She has abrasions to the bridge of her nose.   HPI     Prior to Admission medications   Medication Sig Start Date End Date Taking? Authorizing Provider  albuterol (VENTOLIN HFA) 108 (90 Base) MCG/ACT inhaler Inhale 2 puffs into the lungs every 6 (six) hours as needed for wheezing or shortness of breath.    [provider]  budesonide -formoterol  (SYMBICORT ) 80-4.5 MCG/ACT inhaler Inhale 2 puffs into the lungs 2 (two) times daily. 05/05/20   Shelah Lamar RAMAN, MD  furosemide (LASIX) 20 MG tablet Take 20 mg by mouth daily.    [provider]  irbesartan  (AVAPRO ) 150 MG tablet TAKE 1 TABLET BY MOUTH EVERY DAY 01/27/20   Byrum, Robert S, MD  levothyroxine (SYNTHROID) 125 MCG tablet Take 125 mcg by mouth daily before breakfast. Takes 2 days a week    [provider]  levothyroxine (SYNTHROID, LEVOTHROID) 100 MCG tablet Take 100 mcg by mouth daily before breakfast. Takes 5 days a week    [provider]  pantoprazole  (PROTONIX ) 40 MG tablet Take 1 tablet (40 mg total) by  mouth daily. Patient not taking: Reported on 05/08/2020 03/29/19   Shelah Lamar RAMAN, MD  Spacer/Aero-Holding Chambers (AEROCHAMBER MV) inhaler Use as instructed 05/10/19   Byrum, Robert S, MD    Allergies: Latex, Losartan, Metoprolol, Norvasc [amlodipine], and Vasotec [enalapril]    Review of Systems  Constitutional: Negative.   HENT: Negative.    Respiratory: Negative.    Cardiovascular: Negative.   Gastrointestinal: Negative.   Genitourinary: Negative.   Musculoskeletal:  Negative for back pain, gait problem, neck pain and neck stiffness.       Left rib, left wrist pain  Skin:  Positive for wound.  Neurological: Negative.   All other systems reviewed and are negative.   Updated Vital Signs BP (!) 148/81   Pulse 66   Temp 98.8 F (37.1 C) (Oral)   Resp 18   SpO2 99%   Physical Exam Vitals and nursing note reviewed.  Constitutional:      General: She is not in acute distress.    Appearance: She is well-developed. She is not ill-appearing, toxic-appearing or diaphoretic.  HENT:     Head: Normocephalic.     Comments: No Battle sign    Nose: Nose normal.     Comments: No epistaxis.  Abrasion overlying nasal bridge, no lacerations to suture    Mouth/Throat:     Mouth: Mucous membranes  are moist.     Comments: No loose dentition, tongue midline.  No drooling, dysphagia or trismus Eyes:     Extraocular Movements: Extraocular movements intact.     Pupils: Pupils are equal, round, and reactive to light.     Comments: PERRLA, no traumatic hyphema, no raccoon eye  Neck:     Comments: No midline cervical tenderness, full range of motion Cardiovascular:     Rate and Rhythm: Normal rate.     Pulses: Normal pulses.          Radial pulses are 2+ on the right side and 2+ on the left side.       Dorsalis pedis pulses are 2+ on the right side and 2+ on the left side.     Heart sounds: Normal heart sounds.  Pulmonary:     Effort: Pulmonary effort is normal. No respiratory distress.      Breath sounds: Normal breath sounds.  Abdominal:     General: Bowel sounds are normal. There is no distension.     Palpations: Abdomen is soft.     Tenderness: There is no abdominal tenderness. There is no right CVA tenderness or left CVA tenderness.     Comments: Abdomen soft, nontender  Musculoskeletal:        General: Swelling, tenderness and signs of injury present. No deformity. Normal range of motion.     Cervical back: Normal range of motion and neck supple.     Right lower leg: No edema.     Left lower leg: No edema.     Comments: No midline C/T/L tenderness.  Nontender lower extremities.  Diffuse tenderness left wrist with overlying soft tissue swelling however able to flex, extend, pronate and supinate.  Nontender left hand, forearm, humerus.  Non tender scaphoid on right  Skin:    General: Skin is warm and dry.     Capillary Refill: Capillary refill takes less than 2 seconds.     Comments: Abrasions overlying nasal bridge, no lacerations to suture  Neurological:     General: No focal deficit present.     Mental Status: She is alert.     Cranial Nerves: No cranial nerve deficit.     Sensory: No sensory deficit.     Motor: No weakness.     Gait: Gait normal.  Psychiatric:        Mood and Affect: Mood normal.     (all labs ordered are listed, but only abnormal results are displayed) Labs Reviewed - No data to display  EKG: None  Radiology: DG Wrist Complete Left Result Date: 05/11/2024 EXAM: 3 OR MORE VIEW(S) XRAY OF THE LEFT WRIST 05/11/2024 08:31:00 PM COMPARISON: Left first series 03/21/2007. CLINICAL HISTORY: Fall. Pt states that she had a fall pta, no LOC. Pt struck her face and under left ribs and was seen at [facility] and was told to come to ED for CT and xray. No blood thinners. No neck pain. FINDINGS: BONES AND JOINTS: Mild osteopenia has developed since 2008. No acute fracture. No joint dislocation. Slight chronic widening at the scapholunate interval. There is  progressive joint space loss and spurring at the 1st and 2nd CMC joints, the thumb MCP and IP joints. Other joint spaces are maintained. SOFT TISSUES: There is mild generalized soft tissue swelling. IMPRESSION: 1. No acute fracture or dislocation is seen . 2. Progressive osteoarthritis at the 1st and 2nd CMC joints and the thumb MCP and IP joints. 3. Mild generalized soft tissue swelling.  Electronically signed by: Francis Quam MD 05/11/2024 08:37 PM EDT RP Workstation: [id number]   DG Ribs Unilateral W/Chest Left Result Date: 05/11/2024 EXAM: AP VIEW(S) XRAY OF THE LEFT RIBS AND CHEST 05/11/2024 06:50:00 PM COMPARISON: None available. CLINICAL HISTORY: fall, left lower rib pain. Pt states that she had a fall pta, no LOC. Pt struck her face and under left ribs and was seen at [facility] and was told to come to ED for CT and xray. No blood thinners. No neck pain. FINDINGS: BONES: No acute displaced fracture. Plate and screw fixation of right clavicle. Cervical spinal fusion hardware in place. LUNGS AND PLEURA: No consolidation or pulmonary edema. No pleural effusion or pneumothorax. HEART AND MEDIASTINUM: No acute abnormality of the cardiac and mediastinal silhouettes. Surgical clips in right upper quadrant. IMPRESSION: 1. No acute displaced fracture. Electronically signed by: Norman Gatlin MD 05/11/2024 07:05 PM EDT RP Workstation: [id number]   CT Head Wo Contrast Result Date: 05/11/2024 EXAM: CT HEAD, FACIAL BONES AND CERVICAL SPINE WITHOUT CONTRAST 05/11/2024 06:20:53 PM TECHNIQUE: CT of the head, facial bones and cervical spine was performed without the administration of intravenous contrast. Multiplanar reformatted images are provided for review. Automated exposure control, iterative reconstruction, and/or weight based adjustment of the mA/kV was utilized to reduce the radiation dose to as low as reasonably achievable. COMPARISON: Cervical spine radiographs 06/28/2026. CLINICAL HISTORY: Head trauma, abnormal  mental status (Age 42-64y). Pt states that she had a fall pta, no LOC. Pt struck her face and under left ribs and was seen at [facility] and was told to come to ED for CT and xray. No blood thinners. No neck pain. FINDINGS: CT HEAD BRAIN AND VENTRICLES: No acute intracranial hemorrhage. No mass effect or midline shift. No extra-axial fluid collection. No evidence of acute infarct. No hydrocephalus. SKULL AND SCALP: No acute skull fracture. No scalp hematoma. CT FACIAL BONES FACIAL BONES: Left face contusion. No acute facial fracture. No mandibular dislocation. No suspicious bone lesion. ORBITS: No acute traumatic injury. SINUSES AND MASTOIDS: No acute abnormality. SOFT TISSUES: No acute abnormality. CT CERVICAL SPINE BONES AND ALIGNMENT: ACDF C3-C7. No acute fracture or traumatic malalignment. DEGENERATIVE CHANGES: No significant degenerative changes. SOFT TISSUES: No prevertebral soft tissue swelling. IMPRESSION: 1. No acute intracranial abnormality. 2. No acute fracture or traumatic malalignment of the cervical spine. 3. No acute fracture of the facial bones. Electronically signed by: Norman Gatlin MD 05/11/2024 06:31 PM EDT RP Workstation: [id number]   CT Cervical Spine Wo Contrast Result Date: 05/11/2024 EXAM: CT HEAD, FACIAL BONES AND CERVICAL SPINE WITHOUT CONTRAST 05/11/2024 06:20:53 PM TECHNIQUE: CT of the head, facial bones and cervical spine was performed without the administration of intravenous contrast. Multiplanar reformatted images are provided for review. Automated exposure control, iterative reconstruction, and/or weight based adjustment of the mA/kV was utilized to reduce the radiation dose to as low as reasonably achievable. COMPARISON: Cervical spine radiographs 06/28/2026. CLINICAL HISTORY: Head trauma, abnormal mental status (Age [age]y). Pt states that she had a fall pta, no LOC. Pt struck her face and under left ribs and was seen at [facility] and was told to come to ED for CT and xray. No blood  thinners. No neck pain. FINDINGS: CT HEAD BRAIN AND VENTRICLES: No acute intracranial hemorrhage. No mass effect or midline shift. No extra-axial fluid collection. No evidence of acute infarct. No hydrocephalus. SKULL AND SCALP: No acute skull fracture. No scalp hematoma. CT FACIAL BONES FACIAL BONES: Left face contusion. No acute facial fracture. No mandibular dislocation. No  suspicious bone lesion. ORBITS: No acute traumatic injury. SINUSES AND MASTOIDS: No acute abnormality. SOFT TISSUES: No acute abnormality. CT CERVICAL SPINE BONES AND ALIGNMENT: ACDF C3-C7. No acute fracture or traumatic malalignment. DEGENERATIVE CHANGES: No significant degenerative changes. SOFT TISSUES: No prevertebral soft tissue swelling. IMPRESSION: 1. No acute intracranial abnormality. 2. No acute fracture or traumatic malalignment of the cervical spine. 3. No acute fracture of the facial bones. Electronically signed by: Norman Gatlin MD 05/11/2024 06:31 PM EDT RP Workstation: [id number]   CT Maxillofacial Wo Contrast Result Date: 05/11/2024 EXAM: CT HEAD, FACIAL BONES AND CERVICAL SPINE WITHOUT CONTRAST 05/11/2024 06:20:53 PM TECHNIQUE: CT of the head, facial bones and cervical spine was performed without the administration of intravenous contrast. Multiplanar reformatted images are provided for review. Automated exposure control, iterative reconstruction, and/or weight based adjustment of the mA/kV was utilized to reduce the radiation dose to as low as reasonably achievable. COMPARISON: Cervical spine radiographs 06/28/2026. CLINICAL HISTORY: Head trauma, abnormal mental status (Age 6-64y). Pt states that she had a fall pta, no LOC. Pt struck her face and under left ribs and was seen at [facility] and was told to come to ED for CT and xray. No blood thinners. No neck pain. FINDINGS: CT HEAD BRAIN AND VENTRICLES: No acute intracranial hemorrhage. No mass effect or midline shift. No extra-axial fluid collection. No evidence of acute  infarct. No hydrocephalus. SKULL AND SCALP: No acute skull fracture. No scalp hematoma. CT FACIAL BONES FACIAL BONES: Left face contusion. No acute facial fracture. No mandibular dislocation. No suspicious bone lesion. ORBITS: No acute traumatic injury. SINUSES AND MASTOIDS: No acute abnormality. SOFT TISSUES: No acute abnormality. CT CERVICAL SPINE BONES AND ALIGNMENT: ACDF C3-C7. No acute fracture or traumatic malalignment. DEGENERATIVE CHANGES: No significant degenerative changes. SOFT TISSUES: No prevertebral soft tissue swelling. IMPRESSION: 1. No acute intracranial abnormality. 2. No acute fracture or traumatic malalignment of the cervical spine. 3. No acute fracture of the facial bones. Electronically signed by: Norman Gatlin MD 05/11/2024 06:31 PM EDT RP Workstation: [id number]     .Ortho Injury Treatment  Date/Time: 05/11/2024 9:02 PM  Performed by: Edie Einstein A, PA-C Authorized by: Edie Einstein LABOR, PA-C   Consent:    Consent obtained:  Verbal   Consent given by:  Patient   Risks discussed:  Fracture, nerve damage, restricted joint movement, vascular damage, recurrent dislocation, irreducible dislocation and stiffness   Alternatives discussed:  Referral, immobilization, alternative treatment, no treatment and delayed treatmentInjury location: wrist Location details: left wrist Injury type: soft tissue Pre-procedure neurovascular assessment: neurovascularly intact Pre-procedure distal perfusion: normal Pre-procedure neurological function: normal Pre-procedure range of motion: normal  Anesthesia: Local anesthesia used: no  Patient sedated: NoImmobilization: brace Splint Applied by: ED Nurse Post-procedure neurovascular assessment: post-procedure neurovascularly intact Post-procedure distal perfusion: normal Post-procedure neurological function: normal Post-procedure range of motion: normal      Medications Ordered in the ED  bacitracin ointment (31.5 Applications  Topical Given 05/11/24 2036)  Tdap (ADACEL) injection 0.5 mL (0.5 mLs Intramuscular Given 05/11/24 2136)    73 year old here for evaluation mechanical fall PTA.  Denies LOC, anticoagulation.  Fell on outstretched left wrist as well as has some pain to left lateral ribs and face.  Unsure last tetanus.  Has some overlying abrasions, no lacerations.  No raccoon eyes, Battle sign.  No midline C/T/L tenderness.  She is ambulatory PTA.  Plan on imaging, reassess  Imaging personally viewed and interpreted:  X-ray left ribs without fracture, pneumothorax CT head, max face, cervical  spine without significant abnormality X-ray left wrist with degenerative changes.  No acute fracture  Patient reassessed.  Discussed labs and imaging.  Ambulatory here.  She does have some pain to her wrist we will place in Velcro wrist splint.  She was given incentive spirometer given her rib pain.  No obvious fracture on x-ray.  Will have her follow-up outpatient, return for new or worsening symptoms  The patient has been appropriately medically screened and/or stabilized in the ED. I have low suspicion for any other emergent medical condition which would require further screening, evaluation or treatment in the ED or require inpatient management.  Patient is hemodynamically stable and in no acute distress.  Patient able to ambulate in department prior to ED.  Evaluation does not show acute pathology that would require ongoing or additional emergent interventions while in the emergency department or further inpatient treatment.  I have discussed the diagnosis with the patient and answered all questions.  Pain is been managed while in the emergency department and patient has no further complaints prior to discharge.  Patient is comfortable with plan discussed in room and is stable for discharge at this time.  I have discussed strict return precautions for returning to the emergency department.  Patient was encouraged to  follow-up with PCP/specialist refer to at discharge.                                     Medical Decision Making Amount and/or Complexity of Data Reviewed External Data Reviewed: labs, radiology and notes. Radiology: ordered and independent interpretation performed. Decision-making details documented in ED Course.  Risk OTC drugs. Prescription drug management. Decision regarding hospitalization. Diagnosis or treatment significantly limited by social determinants of health.       Final diagnoses:  Fall, initial encounter  Abrasion  Injury of left wrist, initial encounter    ED Discharge Orders     None          Elna Radovich A, PA-C 05/11/24 2104    Dreama Longs, MD 05/13/24 1407

## 2024-05-11 NOTE — Discharge Instructions (Addendum)
 It was a pleasure taking care of you here today  I have placed you in a Velcro wrist splint for your wrist pain.  Be sure to follow-up with orthopedics if still having pain  I have also given incentive spirometer, use once an hour while awake  Make sure to follow-up outpatient, return for any worsening symptoms

## 2024-05-17 DIAGNOSIS — N952 Postmenopausal atrophic vaginitis: Secondary | ICD-10-CM | POA: Diagnosis not present

## 2024-05-17 DIAGNOSIS — N3020 Other chronic cystitis without hematuria: Secondary | ICD-10-CM | POA: Diagnosis not present

## 2024-05-17 DIAGNOSIS — I10 Essential (primary) hypertension: Secondary | ICD-10-CM | POA: Diagnosis not present

## 2024-05-17 DIAGNOSIS — R609 Edema, unspecified: Secondary | ICD-10-CM | POA: Diagnosis not present

## 2024-05-17 DIAGNOSIS — M810 Age-related osteoporosis without current pathological fracture: Secondary | ICD-10-CM | POA: Diagnosis not present

## 2024-05-17 DIAGNOSIS — E559 Vitamin D deficiency, unspecified: Secondary | ICD-10-CM | POA: Diagnosis not present

## 2024-05-17 DIAGNOSIS — E039 Hypothyroidism, unspecified: Secondary | ICD-10-CM | POA: Diagnosis not present

## 2024-05-17 DIAGNOSIS — E7800 Pure hypercholesterolemia, unspecified: Secondary | ICD-10-CM | POA: Diagnosis not present

## 2024-05-17 DIAGNOSIS — R7303 Prediabetes: Secondary | ICD-10-CM | POA: Diagnosis not present

## 2024-05-21 DIAGNOSIS — I10 Essential (primary) hypertension: Secondary | ICD-10-CM | POA: Diagnosis not present

## 2024-05-28 DIAGNOSIS — J4530 Mild persistent asthma, uncomplicated: Secondary | ICD-10-CM | POA: Diagnosis not present

## 2024-05-28 DIAGNOSIS — E663 Overweight: Secondary | ICD-10-CM | POA: Diagnosis not present

## 2024-05-28 DIAGNOSIS — I10 Essential (primary) hypertension: Secondary | ICD-10-CM | POA: Diagnosis not present

## 2024-05-28 DIAGNOSIS — E039 Hypothyroidism, unspecified: Secondary | ICD-10-CM | POA: Diagnosis not present

## 2024-06-07 DIAGNOSIS — M810 Age-related osteoporosis without current pathological fracture: Secondary | ICD-10-CM | POA: Diagnosis not present

## 2024-06-14 DIAGNOSIS — N3020 Other chronic cystitis without hematuria: Secondary | ICD-10-CM | POA: Diagnosis not present

## 2024-06-20 DIAGNOSIS — I10 Essential (primary) hypertension: Secondary | ICD-10-CM | POA: Diagnosis not present

## 2024-06-27 DIAGNOSIS — E039 Hypothyroidism, unspecified: Secondary | ICD-10-CM | POA: Diagnosis not present

## 2024-06-27 DIAGNOSIS — J4530 Mild persistent asthma, uncomplicated: Secondary | ICD-10-CM | POA: Diagnosis not present

## 2024-06-27 DIAGNOSIS — I10 Essential (primary) hypertension: Secondary | ICD-10-CM | POA: Diagnosis not present

## 2024-06-27 DIAGNOSIS — E663 Overweight: Secondary | ICD-10-CM | POA: Diagnosis not present

## 2024-07-26 ENCOUNTER — Other Ambulatory Visit

## 2024-08-23 ENCOUNTER — Other Ambulatory Visit (HOSPITAL_BASED_OUTPATIENT_CLINIC_OR_DEPARTMENT_OTHER)
